# Patient Record
Sex: Female | Born: 1980 | Race: White | Hispanic: No | Smoking: Never smoker
Health system: Southern US, Community
[De-identification: ages and names within clinical notes are randomized; demographics above are authoritative.]

## PROBLEM LIST (undated history)

## (undated) DIAGNOSIS — F909 Attention-deficit hyperactivity disorder, unspecified type: Secondary | ICD-10-CM

## (undated) DIAGNOSIS — D696 Thrombocytopenia, unspecified: Secondary | ICD-10-CM

## (undated) DIAGNOSIS — F419 Anxiety disorder, unspecified: Secondary | ICD-10-CM

## (undated) HISTORY — DX: Attention-deficit hyperactivity disorder, unspecified type: F90.9

## (undated) HISTORY — PX: BREAST SURGERY: SHX581

## (undated) HISTORY — PX: BUNIONECTOMY: SHX129

## (undated) HISTORY — PX: COSMETIC SURGERY: SHX468

## (undated) HISTORY — DX: Anxiety disorder, unspecified: F41.9

## (undated) HISTORY — PX: NECK SURGERY: SHX720

---

## 1999-08-01 ENCOUNTER — Other Ambulatory Visit: Admission: RE | Admit: 1999-08-01 | Discharge: 1999-08-01 | Payer: Self-pay | Admitting: *Deleted

## 1999-12-04 ENCOUNTER — Inpatient Hospital Stay (HOSPITAL_COMMUNITY): Admission: AD | Admit: 1999-12-04 | Discharge: 1999-12-04 | Payer: Self-pay | Admitting: Obstetrics and Gynecology

## 1999-12-24 HISTORY — PX: AUGMENTATION MAMMAPLASTY: SUR837

## 2000-01-16 ENCOUNTER — Other Ambulatory Visit: Admission: RE | Admit: 2000-01-16 | Discharge: 2000-01-16 | Payer: Self-pay | Admitting: *Deleted

## 2000-02-15 ENCOUNTER — Inpatient Hospital Stay (HOSPITAL_COMMUNITY): Admission: AD | Admit: 2000-02-15 | Discharge: 2000-02-15 | Payer: Self-pay | Admitting: *Deleted

## 2000-02-29 ENCOUNTER — Inpatient Hospital Stay (HOSPITAL_COMMUNITY): Admission: AD | Admit: 2000-02-29 | Discharge: 2000-03-02 | Payer: Self-pay | Admitting: Obstetrics and Gynecology

## 2000-03-07 ENCOUNTER — Encounter: Admission: RE | Admit: 2000-03-07 | Discharge: 2000-06-05 | Payer: Self-pay | Admitting: *Deleted

## 2000-04-10 ENCOUNTER — Other Ambulatory Visit: Admission: RE | Admit: 2000-04-10 | Discharge: 2000-04-10 | Payer: Self-pay | Admitting: *Deleted

## 2006-04-01 ENCOUNTER — Ambulatory Visit: Payer: Self-pay | Admitting: Unknown Physician Specialty

## 2006-04-15 ENCOUNTER — Ambulatory Visit: Payer: Self-pay | Admitting: Unknown Physician Specialty

## 2008-06-22 ENCOUNTER — Ambulatory Visit: Payer: Self-pay | Admitting: Internal Medicine

## 2008-07-12 ENCOUNTER — Ambulatory Visit: Payer: Self-pay | Admitting: Internal Medicine

## 2008-07-23 ENCOUNTER — Ambulatory Visit: Payer: Self-pay | Admitting: Internal Medicine

## 2008-08-23 ENCOUNTER — Ambulatory Visit: Payer: Self-pay | Admitting: Internal Medicine

## 2008-10-23 ENCOUNTER — Ambulatory Visit: Payer: Self-pay | Admitting: Internal Medicine

## 2008-11-09 ENCOUNTER — Ambulatory Visit: Payer: Self-pay | Admitting: Internal Medicine

## 2008-11-22 ENCOUNTER — Ambulatory Visit: Payer: Self-pay | Admitting: Internal Medicine

## 2009-09-26 ENCOUNTER — Emergency Department (HOSPITAL_COMMUNITY): Admission: EM | Admit: 2009-09-26 | Discharge: 2009-09-26 | Payer: Self-pay | Admitting: Emergency Medicine

## 2010-01-13 ENCOUNTER — Encounter: Admission: RE | Admit: 2010-01-13 | Discharge: 2010-01-13 | Payer: Self-pay | Admitting: Specialist

## 2010-08-16 ENCOUNTER — Encounter: Payer: Self-pay | Admitting: Maternal & Fetal Medicine

## 2010-11-07 ENCOUNTER — Observation Stay: Payer: Self-pay | Admitting: Obstetrics and Gynecology

## 2010-11-19 ENCOUNTER — Encounter: Payer: Self-pay | Admitting: Maternal and Fetal Medicine

## 2011-01-03 ENCOUNTER — Observation Stay: Payer: Self-pay

## 2011-01-23 ENCOUNTER — Ambulatory Visit: Payer: Self-pay | Admitting: Internal Medicine

## 2011-02-12 ENCOUNTER — Inpatient Hospital Stay: Payer: Self-pay | Admitting: Obstetrics and Gynecology

## 2011-02-13 ENCOUNTER — Ambulatory Visit: Payer: Self-pay | Admitting: Internal Medicine

## 2011-02-17 ENCOUNTER — Ambulatory Visit: Payer: Self-pay | Admitting: Pediatrics

## 2011-02-21 ENCOUNTER — Ambulatory Visit: Payer: Self-pay | Admitting: Internal Medicine

## 2011-12-13 ENCOUNTER — Emergency Department (HOSPITAL_COMMUNITY)
Admission: EM | Admit: 2011-12-13 | Discharge: 2011-12-13 | Disposition: A | Discharge status: Home / Self Care | Payer: 59 | Source: Home / Self Care | Attending: Family Medicine | Admitting: Family Medicine

## 2011-12-13 ENCOUNTER — Encounter: Payer: Self-pay | Admitting: *Deleted

## 2011-12-13 DIAGNOSIS — R6889 Other general symptoms and signs: Secondary | ICD-10-CM

## 2011-12-13 HISTORY — DX: Thrombocytopenia, unspecified: D69.6

## 2011-12-13 MED ORDER — AZITHROMYCIN 250 MG PO TABS: 250.0000 mg | ORAL_TABLET | Freq: Every day | ORAL | Status: AC

## 2011-12-13 MED ORDER — GUAIFENESIN-CODEINE 100-10 MG/5ML PO SYRP: 5.0000 mL | ORAL_SOLUTION | Freq: Four times a day (QID) | ORAL | Status: AC | PRN

## 2011-12-13 NOTE — ED Provider Notes (Signed)
History     CSN: [id number]  Arrival date & time 12/13/11  9147   First MD Initiated Contact with Patient 12/13/11 1040      Chief Complaint  Patient presents with  . Sore Throat    (Consider location/radiation/quality/duration/timing/severity/associated sxs/prior treatment) Patient is a 30 y.o. female presenting with pharyngitis. The history is provided by the patient.  Sore Throat The current episode started yesterday. The problem occurs constantly. The problem has not changed since onset.Associated symptoms comments: Runny nose. Exposed to strep . The symptoms are aggravated by swallowing. The symptoms are relieved by nothing.    Past Medical History  Diagnosis Date  . Platelets decreased     Past Surgical History  Procedure Date  . Bunionectomy     bilateral    History reviewed. No pertinent family history.  History  Substance Use Topics  . Smoking status: Never Smoker   . Smokeless tobacco: Not on file  . Alcohol Use: Yes     socially    OB History    Grav Para Term Preterm Abortions TAB SAB Ect Mult Living                  Review of Systems  Constitutional: Negative.   HENT: Positive for sore throat and rhinorrhea. Negative for ear pain, congestion, sneezing and sinus pressure.   Respiratory: Negative.   Cardiovascular: Negative.   Gastrointestinal: Negative.   Skin: Negative.     Allergies  Review of patient's allergies indicates no known allergies.  Home Medications   Current Outpatient Rx  Name Route Sig Dispense Refill  . AZITHROMYCIN 250 MG PO TABS Oral Take 1 tablet (250 mg total) by mouth daily. Take first 2 tablets together, then 1 every day until finished. 6 tablet 0  . GUAIFENESIN-CODEINE 100-10 MG/5ML PO SYRP Oral Take 5 mLs by mouth every 6 (six) hours as needed for cough. 120 mL 0    BP 118/78  Pulse 59  Temp(Src) 97.4 F (36.3 C) (Oral)  Resp 16  SpO2 98%  LMP 12/05/2011  Physical Exam  Nursing note and vitals  reviewed. Constitutional: She appears well-developed and well-nourished. No distress.  HENT:  Head: Normocephalic and atraumatic.  Right Ear: External ear normal.  Left Ear: External ear normal.       Throat red and slightly swollen  Cardiovascular: Normal rate and regular rhythm.   Pulmonary/Chest: Effort normal and breath sounds normal.  Abdominal: Soft.  Lymphadenopathy:    She has no cervical adenopathy.  Skin: Skin is warm and dry.    ED Course  Procedures (including critical care time)  Labs Reviewed - No data to display No results found.   1. Flu-like symptoms       MDM          Randa Spike, MD 12/13/11 [phone]

## 2011-12-13 NOTE — ED Notes (Signed)
Pt states she started with sorethroat (felt like she had razorblades in her throat when she swallowed) yesterday.  Today has runny nose and slight cough.  No fever that she is aware of. States she was around someone with strep the other day and has a 64 month old at home that she doesn't want to make sick.  Throat red, no exudate noted.

## 2014-05-02 ENCOUNTER — Ambulatory Visit
Admission: RE | Admit: 2014-05-02 | Discharge: 2014-05-02 | Disposition: A | Discharge status: Home / Self Care | Payer: No Typology Code available for payment source | Source: Ambulatory Visit | Attending: Occupational Medicine | Admitting: Occupational Medicine

## 2014-05-02 ENCOUNTER — Other Ambulatory Visit: Payer: Self-pay | Admitting: Occupational Medicine

## 2014-05-02 DIAGNOSIS — Z0000 Encounter for general adult medical examination without abnormal findings: Secondary | ICD-10-CM

## 2015-05-16 ENCOUNTER — Emergency Department (HOSPITAL_COMMUNITY): Payer: 59

## 2015-05-16 ENCOUNTER — Encounter (HOSPITAL_COMMUNITY): Payer: Self-pay | Admitting: Cardiology

## 2015-05-16 ENCOUNTER — Emergency Department (HOSPITAL_COMMUNITY)
Admission: EM | Admit: 2015-05-16 | Discharge: 2015-05-16 | Disposition: A | Discharge status: Home / Self Care | Payer: 59 | Attending: Emergency Medicine | Admitting: Emergency Medicine

## 2015-05-16 DIAGNOSIS — R531 Weakness: Secondary | ICD-10-CM | POA: Insufficient documentation

## 2015-05-16 DIAGNOSIS — Z79899 Other long term (current) drug therapy: Secondary | ICD-10-CM | POA: Diagnosis not present

## 2015-05-16 DIAGNOSIS — Z7952 Long term (current) use of systemic steroids: Secondary | ICD-10-CM | POA: Diagnosis not present

## 2015-05-16 DIAGNOSIS — Z862 Personal history of diseases of the blood and blood-forming organs and certain disorders involving the immune mechanism: Secondary | ICD-10-CM | POA: Insufficient documentation

## 2015-05-16 DIAGNOSIS — M79602 Pain in left arm: Secondary | ICD-10-CM | POA: Diagnosis present

## 2015-05-16 DIAGNOSIS — M5410 Radiculopathy, site unspecified: Secondary | ICD-10-CM | POA: Diagnosis not present

## 2015-05-16 LAB — COMPREHENSIVE METABOLIC PANEL
ALT: 18 U/L (ref 14–54)
AST: 20 U/L (ref 15–41)
Albumin: 3.9 g/dL (ref 3.5–5.0)
Alkaline Phosphatase: 43 U/L (ref 38–126)
Anion gap: 9 (ref 5–15)
BUN: 13 mg/dL (ref 6–20)
CO2: 22 mmol/L (ref 22–32)
Calcium: 9.6 mg/dL (ref 8.9–10.3)
Chloride: 107 mmol/L (ref 101–111)
Creatinine, Ser: 0.65 mg/dL (ref 0.44–1.00)
GFR calc Af Amer: >60 mL/min (ref >60)
GFR calc non Af Amer: >60 mL/min (ref >60)
Glucose, Bld: 106 mg/dL — ABNORMAL HIGH (ref 65–99)
Potassium: 4 mmol/L (ref 3.5–5.1)
Sodium: 138 mmol/L (ref 135–145)
Total Bilirubin: 0.6 mg/dL (ref 0.3–1.2)
Total Protein: 6.8 g/dL (ref 6.5–8.1)

## 2015-05-16 LAB — CBC WITH DIFFERENTIAL/PLATELET
Basophils Absolute: 0 10*3/uL (ref 0.0–0.1)
Basophils Relative: 0 % (ref 0–1)
Eosinophils Absolute: 0 10*3/uL (ref 0.0–0.7)
Eosinophils Relative: 0 % (ref 0–5)
HCT: 42.3 % (ref 36.0–46.0)
Hemoglobin: 14.7 g/dL (ref 12.0–15.0)
Lymphocytes Relative: 13 % (ref 12–46)
Lymphs Abs: 0.9 10*3/uL (ref 0.7–4.0)
MCH: 30.6 pg (ref 26.0–34.0)
MCHC: 34.8 g/dL (ref 30.0–36.0)
MCV: 88.1 fL (ref 78.0–100.0)
Monocytes Absolute: 0.2 10*3/uL (ref 0.1–1.0)
Monocytes Relative: 2 % — ABNORMAL LOW (ref 3–12)
Neutro Abs: 5.8 10*3/uL (ref 1.7–7.7)
Neutrophils Relative %: 85 % — ABNORMAL HIGH (ref 43–77)
Platelets: 133 10*3/uL — ABNORMAL LOW (ref 150–400)
RBC: 4.8 MIL/uL (ref 3.87–5.11)
RDW: 12.5 % (ref 11.5–15.5)
WBC: 6.9 10*3/uL (ref 4.0–10.5)

## 2015-05-16 MED ORDER — OXYCODONE-ACETAMINOPHEN 5-325 MG PO TABS: 1.0000 | ORAL_TABLET | Freq: Once | ORAL | Status: DC

## 2015-05-16 MED ORDER — HYDROMORPHONE HCL 1 MG/ML IJ SOLN
1.0000 mg | Freq: Once | INTRAMUSCULAR | Status: AC
  Administered 2015-05-16: 1 mg via INTRAVENOUS
  Filled 2015-05-16: qty 1

## 2015-05-16 MED ORDER — PREDNISONE 10 MG PO TABS: ORAL_TABLET | ORAL | Status: DC

## 2015-05-16 MED ORDER — OXYCODONE-ACETAMINOPHEN 5-325 MG PO TABS: 1.0000 | ORAL_TABLET | ORAL | Status: DC | PRN

## 2015-05-16 NOTE — ED Provider Notes (Signed)
CSN: [id number]     Arrival date & time 05/16/15  6578 History   First MD Initiated Contact with Patient 05/16/15 1057     Chief Complaint  Patient presents with  . Back Pain  . Arm Pain    HPI   34 year old female presents today with neck and shoulder and back pain. Patient additionally notes decreased left-sided sensation, and weakness. The patient reports that one week ago after cleaning her house she developed left shoulder tightness and "knot" . She states that she went to a massage therapist but continued to have pain, it progressed into left shoulder pain with radiation up into her neck and down her arm. He reports a "achy" sensation into the left arm with decreased sensation, and starting today inability to use extremity. She also notes weakness on the left side of her leg, no decreased sensation. Patient denies history of trauma, history of the same previously, headache, nausea, vomiting, fever, shortness of breath, abdominal pain, lower extremity swelling or edema, saddle anesthesia, bowel or bladder incontinence. She reports that she was originally seen at urgent care who gave her a steroid injection, and Flexeril. She notes those therapies did not help, followed up with her primary care who started her on prednisone, Percocet, and ibuprofen. He reported that if symptoms continue to persist she may need an MRI for further evaluation. She reports that the pain has continued to persist, worsening with the addition of her weakness on the left side today. Patient denies any recent illness, no significant or contributing comorbidities.     Past Medical History  Diagnosis Date  . Platelets decreased    Past Surgical History  Procedure Laterality Date  . Bunionectomy      bilateral   History reviewed. No pertinent family history. History  Substance Use Topics  . Smoking status: Never Smoker   . Smokeless tobacco: Not on file  . Alcohol Use: Yes     Comment: socially   OB History     No data available     Review of Systems  All other systems reviewed and are negative.   Allergies  Review of patient's allergies indicates no known allergies.  Home Medications   Prior to Admission medications   Medication Sig Start Date End Date Taking? Authorizing Provider  acetaminophen (TYLENOL) 500 MG tablet Take 1,000 mg by mouth every 6 (six) hours as needed for mild pain.   Yes Historical Provider, MD  cyclobenzaprine (FLEXERIL) 10 MG tablet Take 10 mg by mouth 3 (three) times daily as needed for muscle spasms.   Yes Historical Provider, MD  docusate sodium (COLACE) 50 MG capsule Take 50 mg by mouth 2 (two) times daily.   Yes Historical Provider, MD  ibuprofen (ADVIL,MOTRIN) 600 MG tablet Take 600 mg by mouth every 8 (eight) hours as needed for mild pain.   Yes Historical Provider, MD  lisdexamfetamine (VYVANSE) 30 MG capsule Take 30 mg by mouth daily.   Yes Historical Provider, MD  lisdexamfetamine (VYVANSE) 40 MG capsule Take 40 mg by mouth every morning.   Yes Historical Provider, MD  loratadine (CLARITIN) 10 MG tablet Take 10 mg by mouth daily.   Yes Historical Provider, MD  Norgestimate-Ethinyl Estradiol Triphasic 0.18/0.215/0.25 MG-35 MCG tablet Take 1 tablet by mouth daily.   Yes Historical Provider, MD  oxyCODONE-acetaminophen (PERCOCET/ROXICET) 5-325 MG per tablet Take 1-2 tablets by mouth every 6 (six) hours as needed for severe pain.   Yes Historical Provider, MD  tapentadol (NUCYNTA) 50  MG TABS tablet Take 50 mg by mouth every 6 (six) hours as needed for moderate pain.   Yes Historical Provider, MD  cyclobenzaprine (FLEXERIL) 5 MG tablet Take 5 mg by mouth 3 (three) times daily as needed for muscle spasms.    Historical Provider, MD  predniSONE (DELTASONE) 20 MG tablet Take 20 mg by mouth daily with breakfast.    Historical Provider, MD   BP 140/108 mmHg  Pulse 97  Temp(Src) 98.1 F (36.7 C)  Resp 17  Ht  (1.702 m)  Wt 136 lb (61.689 kg)  BMI 21.30 kg/m2   SpO2 100%  LMP 04/25/2015 Physical Exam  Constitutional: She is oriented to person, place, and time. She appears well-developed and well-nourished.  HENT:  Head: Normocephalic and atraumatic.  Eyes: Conjunctivae are normal. Pupils are equal, round, and reactive to light. Right eye exhibits no discharge. Left eye exhibits no discharge. No scleral icterus.  Neck: Normal range of motion. No JVD present. No tracheal deviation present.  Pulmonary/Chest: Effort normal. No stridor.  Musculoskeletal:  No obvious deformities, asymmetry, or atrophy of the neck shoulder upper lower extremities. No signs of trauma.  Strength 2/5 to left upper extremity with flexion and extension rotation, grip 3/5  Lower extremity hip flexion, knee flexion  and extension 4 out of 5  Patellar reflexes 2+, triceps 2+    Neurological: She is alert and oriented to person, place, and time. She displays no atrophy and no tremor. A sensory deficit is present. No cranial nerve deficit. She exhibits normal muscle tone. She displays no seizure activity. Coordination normal. GCS eye subscore is 4. GCS verbal subscore is 5. GCS motor subscore is 6.  Reflex Scores:      Bicep reflexes are 2+ on the right side and 2+ on the left side.      Patellar reflexes are 2+ on the right side and 2+ on the left side. Decreased sensation to entire left upper extremity with no focal findings  Psychiatric: She has a normal mood and affect. Her behavior is normal. Judgment and thought content normal.  Nursing note and vitals reviewed.   ED Course  Procedures (including critical care time) Labs Review Labs Reviewed - No data to display  Imaging Review Mr Brain Wo Contrast  05/16/2015   CLINICAL DATA:  Weakness.  Burning pain left arm  EXAM: MRI HEAD WITHOUT CONTRAST  TECHNIQUE: Multiplanar, multiecho pulse sequences of the brain and surrounding structures were obtained without intravenous contrast.  COMPARISON:  MR head 01/13/2010   FINDINGS: Ventricle size is normal.  Cerebral volume is normal.  Pituitary normal in size. Cervical medullary junction normal. Paranasal sinuses show mild mucosal edema bilateral maxillary sinuses. Mastoid sinus is clear bilaterally.  Negative for acute infarct.  No significant chronic ischemia  Negative for demyelinating disease.  Cerebral white matter normal.  Negative for intracranial hemorrhage  Negative for mass or edema.  IMPRESSION: Normal unenhanced MRI of the brain.   Electronically Signed   By: Marlan Palau M.D.   On: 05/16/2015 13:54   Mr Cervical Spine Wo Contrast  05/16/2015   CLINICAL DATA:  34 year old female with new onset left arm pain. No relief with conservative treatment. No known injury. Initial encounter.  EXAM: MRI CERVICAL SPINE WITHOUT CONTRAST  TECHNIQUE: Multiplanar, multisequence MR imaging of the cervical spine was performed. No intravenous contrast was administered.  COMPARISON:  01/13/2010 brain MR. No comparison cervical spine MR.  FINDINGS: Some of the sequences are motion degraded.  Cervical medullary junction and visualized intracranial structures unremarkable. No focal cervical cord signal abnormality.  Visualized paravertebral structures unremarkable.  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  C2-3:  Negative.  C3-4: Minimal bulge. Minimal uncinate hypertrophy. Minimal narrowing ventral aspect of the thecal sac and neural foramen.  C4-5: Minimal to mild bulge greater to left. Slight narrowing ventral aspect of the thecal sac greater on left.  C5-6: Moderate to large broad-based protrusion with greatest extension left paracentral/ posterior lateral position. Elevation of the posterior longitudinal ligament. Spinal stenosis with cord flattening greater on the left. Narrowing of the origin of the neural foramen greater on the left.  C6-7:  Minimal right uncinate hypertrophy and foraminal narrowing.  C7-T1:  Negative.  IMPRESSION: Most notable finding on the present examination is  the C5-6 disc protrusion which is greater to the left with associated mass effect as detailed above. Kyphosis is centered at this level.   Electronically Signed   By: Lacy Duverney M.D.   On: 05/16/2015 13:33     EKG Interpretation   Date/Time:  Tuesday May 16 2015 10:07:58 EDT Ventricular Rate:  99 PR Interval:  112 QRS Duration: 80 QT Interval:  336 QTC Calculation: 431 R Axis:   83 Text Interpretation:  Normal sinus rhythm Nonspecific ST abnormality  Abnormal ECG Confirmed by HARRISON  MD, FORREST ([id number]) on 05/16/2015  11:01:41 AM      MDM   Final diagnoses:  Weakness  Radiculopathy affecting upper extremity    Labs: CBC, CMP  Imaging: MRI brain without, MRI cervical without- significant for C5-C6 with disc protrusion  Consults: none  Therapeutics:  Dilaudid, Percocet  Assessment: Radiculopathy  Plan: Patient originally presented with progressive neck stiffness, soreness and progressed to decreased sensation or weakness of her left upper extremity. This was an atraumatic event, likely exacerbated by her activity of cleaning the house. At time of my initial evaluation patient reported left-sided weakness including upper and lower extremities with decreased sensation throughout. 2+ patellar and tricep reflexes at that time. Patient denied headache, cranial nerve defects, or any other focal neurologic deficits other than those indicated above. MRI brain showed no acute findings, she was treated with Dilaudid with great improvement in both her neuro and pain scale. MRI cervical showed significance of C5-C6 disc protrusion. Repeat neuro exam showed no focal sensory deficits to the upper or lower extremities. Lower extremities strength 5/5 with 2+ patellar reflexes. Her upper extremity strength was diminished, improved from initial eval; patient reports this is due to pain and not necessarily true "weakness". She had good flexion extension of her wrist grip strength normal, elbow  flexion and extension 4 out of 5, shoulder exam difficult due to pain. Uncertain why patient's repeat neuro exam was significantly improved with only pain medication; although I feel her original neuro deficits or due to patient effort. Due to patient's rapidly improving neuro exam, controlled pain, ability for close specialty follow-up patient was discharged home. He was continued on her prednisone therapy with another 7 days, given another prescription for her oral pain medication, instructed to use her Flexeril that was previously prescribed. Neurosurgical contact information was given, patient instructed to contact them immediately after leaving the ED for immediate follow-up evaluation. Patient was given strict return precautions the event symptoms worsened, she verbalized her understanding to the importance of follow-up evaluation and return to the emergency room if needed. She was discharged home with her husband and mother who agreed to monitor her for new or worsening  changes patient had no further questions or concerns at time of discharge.      Cecia Egge, PA-C 05/24/1Eyvonne Mechanic6 [id number]  Purvis SheffieldForrest Harrison, MD 05/16/15 [phone]

## 2015-05-16 NOTE — ED Notes (Signed)
MD at bedside. 

## 2015-05-16 NOTE — ED Notes (Signed)
Pt reports she was seen over the week and given pain medication and prednisone for back pain and pain that is radiating down into her left arm. Reports the pain is sharp in nature and causing some SOB. Told to ome in if the pain was no better and she may need an MRI.

## 2015-05-16 NOTE — Discharge Instructions (Signed)
Please monitor for worsening signs or symptoms, return to the emergency room immediately if any present. His contact neurosurgery for further evaluation and management. Please use medication as directed.

## 2015-07-05 ENCOUNTER — Encounter (HOSPITAL_COMMUNITY): Payer: Self-pay | Admitting: Emergency Medicine

## 2015-07-05 DIAGNOSIS — Z3202 Encounter for pregnancy test, result negative: Secondary | ICD-10-CM | POA: Insufficient documentation

## 2015-07-05 DIAGNOSIS — K5900 Constipation, unspecified: Secondary | ICD-10-CM | POA: Diagnosis not present

## 2015-07-05 DIAGNOSIS — Z862 Personal history of diseases of the blood and blood-forming organs and certain disorders involving the immune mechanism: Secondary | ICD-10-CM | POA: Diagnosis not present

## 2015-07-05 DIAGNOSIS — Z79899 Other long term (current) drug therapy: Secondary | ICD-10-CM | POA: Insufficient documentation

## 2015-07-05 DIAGNOSIS — N760 Acute vaginitis: Secondary | ICD-10-CM | POA: Insufficient documentation

## 2015-07-05 DIAGNOSIS — Z7951 Long term (current) use of inhaled steroids: Secondary | ICD-10-CM | POA: Diagnosis not present

## 2015-07-05 DIAGNOSIS — R109 Unspecified abdominal pain: Secondary | ICD-10-CM | POA: Diagnosis present

## 2015-07-05 NOTE — ED Notes (Signed)
Pt had neck surgery on Friday.  States last BM was 1 week ago.  Pt taking medications today to try to relieve constipation.  States she felt a lot of pressure around 9pm while trying to use bathroom and when she got up she noticed vaginal prolapse.  C/o generalized abd pain and vaginal pain.

## 2015-07-06 ENCOUNTER — Encounter (HOSPITAL_COMMUNITY): Payer: Self-pay

## 2015-07-06 ENCOUNTER — Emergency Department (HOSPITAL_COMMUNITY)
Admission: EM | Admit: 2015-07-06 | Discharge: 2015-07-06 | Disposition: A | Discharge status: Home / Self Care | Payer: 59 | Attending: Emergency Medicine | Admitting: Emergency Medicine

## 2015-07-06 ENCOUNTER — Emergency Department (HOSPITAL_COMMUNITY): Payer: 59

## 2015-07-06 DIAGNOSIS — K5900 Constipation, unspecified: Secondary | ICD-10-CM

## 2015-07-06 DIAGNOSIS — N760 Acute vaginitis: Secondary | ICD-10-CM

## 2015-07-06 DIAGNOSIS — B9689 Other specified bacterial agents as the cause of diseases classified elsewhere: Secondary | ICD-10-CM

## 2015-07-06 LAB — URINALYSIS, ROUTINE W REFLEX MICROSCOPIC
Bilirubin Urine: NEGATIVE
Glucose, UA: NEGATIVE mg/dL
HGB URINE DIPSTICK: NEGATIVE
Ketones, ur: NEGATIVE mg/dL
Leukocytes, UA: NEGATIVE
Nitrite: NEGATIVE
Protein, ur: NEGATIVE mg/dL
Specific Gravity, Urine: 1.011 (ref 1.005–1.030)
Urobilinogen, UA: 0.2 mg/dL (ref 0.0–1.0)
pH: 6.5 (ref 5.0–8.0)

## 2015-07-06 LAB — COMPREHENSIVE METABOLIC PANEL
ALBUMIN: 3.9 g/dL (ref 3.5–5.0)
ALK PHOS: 77 U/L (ref 38–126)
ALT: 21 U/L (ref 14–54)
ANION GAP: 6 (ref 5–15)
AST: 21 U/L (ref 15–41)
BUN: 9 mg/dL (ref 6–20)
CO2: 28 mmol/L (ref 22–32)
CREATININE: 0.72 mg/dL (ref 0.44–1.00)
Calcium: 9.3 mg/dL (ref 8.9–10.3)
Chloride: 102 mmol/L (ref 101–111)
GFR calc Af Amer: >60 mL/min (ref >60)
Glucose, Bld: 127 mg/dL — ABNORMAL HIGH (ref 65–99)
Potassium: 4.4 mmol/L (ref 3.5–5.1)
Sodium: 136 mmol/L (ref 135–145)
TOTAL PROTEIN: 7.2 g/dL (ref 6.5–8.1)
Total Bilirubin: 0.5 mg/dL (ref 0.3–1.2)

## 2015-07-06 LAB — LIPASE, BLOOD: LIPASE: 20 U/L — AB (ref 22–51)

## 2015-07-06 LAB — CBC
HEMATOCRIT: 39 % (ref 36.0–46.0)
Hemoglobin: 13.5 g/dL (ref 12.0–15.0)
MCH: 30.9 pg (ref 26.0–34.0)
MCHC: 34.6 g/dL (ref 30.0–36.0)
MCV: 89.2 fL (ref 78.0–100.0)
Platelets: 142 10*3/uL — ABNORMAL LOW (ref 150–400)
RBC: 4.37 MIL/uL (ref 3.87–5.11)
RDW: 12.3 % (ref 11.5–15.5)
WBC: 7.7 10*3/uL (ref 4.0–10.5)

## 2015-07-06 LAB — WET PREP, GENITAL
TRICH WET PREP: NONE SEEN
WBC, Wet Prep HPF POC: NONE SEEN
Yeast Wet Prep HPF POC: NONE SEEN

## 2015-07-06 LAB — I-STAT BETA HCG BLOOD, ED (MC, WL, AP ONLY): I-stat hCG, quantitative: <5 m[IU]/mL (ref <5)

## 2015-07-06 LAB — PREGNANCY, URINE: Preg Test, Ur: NEGATIVE

## 2015-07-06 MED ORDER — METRONIDAZOLE 500 MG PO TABS
500.0000 mg | ORAL_TABLET | Freq: Once | ORAL | Status: AC
  Administered 2015-07-06: 500 mg via ORAL
  Filled 2015-07-06: qty 1

## 2015-07-06 MED ORDER — POLYETHYLENE GLYCOL 3350 17 GM/SCOOP PO POWD: 17.0000 g | Freq: Two times a day (BID) | ORAL | Status: DC

## 2015-07-06 MED ORDER — KETOROLAC TROMETHAMINE 30 MG/ML IJ SOLN
30.0000 mg | Freq: Once | INTRAMUSCULAR | Status: AC
  Administered 2015-07-06: 30 mg via INTRAVENOUS
  Filled 2015-07-06: qty 1

## 2015-07-06 MED ORDER — SENNOSIDES-DOCUSATE SODIUM 8.6-50 MG PO TABS: 2.0000 | ORAL_TABLET | Freq: Two times a day (BID) | ORAL | Status: DC

## 2015-07-06 MED ORDER — SODIUM CHLORIDE 0.9 % IV BOLUS (SEPSIS)
1000.0000 mL | Freq: Once | INTRAVENOUS | Status: AC
  Administered 2015-07-06: 1000 mL via INTRAVENOUS

## 2015-07-06 MED ORDER — DICYCLOMINE HCL 10 MG/ML IM SOLN
20.0000 mg | Freq: Once | INTRAMUSCULAR | Status: AC
  Administered 2015-07-06: 20 mg via INTRAMUSCULAR
  Filled 2015-07-06: qty 2

## 2015-07-06 MED ORDER — METRONIDAZOLE 500 MG PO TABS: 500.0000 mg | ORAL_TABLET | Freq: Two times a day (BID) | ORAL | Status: DC

## 2015-07-06 MED ORDER — MELOXICAM 7.5 MG PO TABS: 7.5000 mg | ORAL_TABLET | Freq: Every day | ORAL | Status: DC

## 2015-07-06 NOTE — ED Notes (Signed)
Pt in bathroom

## 2015-07-06 NOTE — ED Notes (Signed)
Called lab to check urine pregnancy results, test was not run.  Will be processing now.

## 2015-07-06 NOTE — ED Provider Notes (Signed)
CSN: [id number]     Arrival date & time 07/05/15  2344 History  This chart was scribed for Brianna Junita Kubota, MD by Abel PrestoKara Demonbreun, ED Scribe. This patient was seen in room B14C/B14C and the patient's care was started at 12:47 AM.    Chief Complaint  Patient presents with  . Vaginal Prolapse     Patient is a 34 y.o. female presenting with constipation. The history is provided by the patient. No language interpreter was used.  Constipation Severity:  Severe Time since last bowel movement:  1 week Timing:  Constant Progression:  Unchanged Chronicity:  New Context: not dehydration   Stool description:  Watery Relieved by:  Nothing Worsened by:  Narcotic pain medications Ineffective treatments:  Laxatives (suppository) Associated symptoms: abdominal pain   Associated symptoms: no diarrhea, no fever, no nausea and no vomiting   Risk factors: no hx of abdominal surgery    HPI Comments: Brianna Perkins is a 34 y.o. female who presents to the Emergency Department complaining of constipation with last BM 1 week ago. 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 Pt states she has used OTC relief since onset of constipation. She was seen by surgeon today who instructed her to try a suppository for relief. She did so and used magnesium citrate with no relief. She reports associated generalized abdominal pressure and vaginal pain. After no relief she attempted to place another suppository and states she "felt stuff" from her vagina. She is concerned for a vaginal prolapse. Pt denies any other complaints at this time. Per [place] [facility], pt was prescribed 90 pills Dilaudid on 07/01/15, 60 Dilaudid pills on 06/12/15, and 60 pills 05/27/15 oxycodone.  Past Medical History  Diagnosis Date  . Platelets decreased    Past Surgical History  Procedure Laterality Date  . Bunionectomy      bilateral  . Neck surgery    . Cosmetic surgery    . Breast surgery     History reviewed. No pertinent family history. History   Substance Use Topics  . Smoking status: Never Smoker   . Smokeless tobacco: Not on file  . Alcohol Use: Yes     Comment: socially   OB History    No data available     Review of Systems  Constitutional: Negative for fever and chills.  Gastrointestinal: Positive for abdominal pain and constipation. Negative for nausea, vomiting and diarrhea.  Genitourinary: Positive for vaginal pain.  All other systems reviewed and are negative.     Allergies  Review of patient's allergies indicates no known allergies.  Home Medications   Prior to Admission medications   Medication Sig Start Date End Date Taking? Authorizing Provider  acetaminophen (TYLENOL) 500 MG tablet Take 1,000 mg by mouth every 6 (six) hours as needed for mild pain.    Historical Provider, MD  atomoxetine (STRATTERA) 60 MG capsule Take 60 mg by mouth daily.    Historical Provider, MD  cyclobenzaprine (FLEXERIL) 10 MG tablet Take 10 mg by mouth 3 (three) times daily as needed for muscle spasms.    Historical Provider, MD  DEXAMETHASONE ACETATE IJ Inject 1 Dose as directed once.    Historical Provider, MD  docusate sodium (COLACE) 50 MG capsule Take 50 mg by mouth 2 (two) times daily.    Historical Provider, MD  fluticasone (FLONASE) 50 MCG/ACT nasal spray Place 1 spray into both nostrils daily.    Historical Provider, MD  ibuprofen (ADVIL,MOTRIN) 600 MG tablet Take 600 mg by mouth every  8 (eight) hours as needed for mild pain.    Historical Provider, MD  lisdexamfetamine (VYVANSE) 30 MG capsule Take 30 mg by mouth daily. 30mg  in the morning, 40mg  in the evening    Historical Provider, MD  lisdexamfetamine (VYVANSE) 40 MG capsule Take 40 mg by mouth every evening. 30mg  in the morning, 40mg  in the evening    Historical Provider, MD  loratadine (CLARITIN) 10 MG tablet Take 10 mg by mouth daily.    Historical Provider, MD  NALBUPHINE HCL IJ Inject 1 Dose as directed once.    Historical Provider, MD  Norgestimate-Ethinyl  Estradiol Triphasic 0.18/0.215/0.25 MG-35 MCG tablet Take 1 tablet by mouth daily.    Historical Provider, MD  oxyCODONE-acetaminophen (PERCOCET/ROXICET) 5-325 MG per tablet Take 1 tablet by mouth every 4 (four) hours as needed for severe pain. 05/16/15   Eyvonne Mechanic, PA-C  predniSONE (DELTASONE) 10 MG tablet 40 mg x 1 day 1-3, 20 mg x1 day 4-5 , 10 mg x 1 day 5-7 05/16/15   Eyvonne Mechanic, PA-C  tapentadol (NUCYNTA) 50 MG TABS tablet Take 50 mg by mouth every 6 (six) hours as needed for moderate pain.    Historical Provider, MD   BP 117/74 mmHg  Pulse 89  Temp(Src) 98.3 F (36.8 C) (Oral)  Resp 18  SpO2 100%  LMP 06/19/2015 Physical Exam  Constitutional: She is oriented to person, place, and time. She appears well-developed and well-nourished.  HENT:  Head: Normocephalic and atraumatic.  Mouth/Throat: Oropharynx is clear and moist.  Eyes: Conjunctivae and EOM are normal. Pupils are equal, round, and reactive to light.  Neck: Normal range of motion. Neck supple.  Incision CDI  Cardiovascular: Normal rate, regular rhythm and normal heart sounds.  Exam reveals no friction rub.   No murmur heard. Pulmonary/Chest: Effort normal and breath sounds normal. She has no wheezes. She has no rales.  Abdominal: Soft. Bowel sounds are increased. There is no tenderness. There is no rigidity, no rebound, no guarding, no tenderness at McBurney's point and negative Murphy's sign.  Constipation throughout  Genitourinary:    Vaginal discharge found.  Swelling to left labia minora, cervix is closed and high, chaperone present  Musculoskeletal: Normal range of motion.  Neurological: She is alert and oriented to person, place, and time.  Skin: Skin is warm and dry.  Psychiatric: She has a normal mood and affect. Her behavior is normal.  Nursing note and vitals reviewed.   ED Course  Procedures (including critical care time) DIAGNOSTIC STUDIES: Oxygen Saturation is 100% on room, normal by my  interpretation.    COORDINATION OF CARE: 12:53 AM Discussed treatment plan with patient at beside, the patient agrees with the plan and has no further questions at this time.   Labs Review Labs Reviewed  WET PREP, GENITAL - Abnormal; Notable for the following:    Clue Cells Wet Prep HPF POC FEW (*)    All other components within normal limits  COMPREHENSIVE METABOLIC PANEL - Abnormal; Notable for the following:    Glucose, Bld 127 (*)    All other components within normal limits  CBC - Abnormal; Notable for the following:    Platelets 142 (*)    All other components within normal limits  LIPASE, BLOOD - Abnormal; Notable for the following:    Lipase 20 (*)    All other components within normal limits  URINALYSIS, ROUTINE W REFLEX MICROSCOPIC (NOT AT [facility])  PREGNANCY, URINE  I-STAT BETA HCG BLOOD, ED ([facility], [facility],  [facility] ONLY)    Imaging Review No results found.   EKG Interpretation None      MDM   Final diagnoses:  None  Swelling of the labia minora, likely from pressure there is no prolapse.  Will treat for BV though swelling is likely from straining to have BM. Witch hazel on pads, follow up with GYN.  miralax BID x 7 days with sennokot BID.  Will not prescribe more narcotics for constipation as that is the source of the pain.  Follow up with your surgeon  I personally performed the services described in this documentation, which was scribed in my presence. The recorded information has been reviewed and is accurate.      Cy Blamer, MD 07/06/15 [phone]

## 2015-07-06 NOTE — ED Notes (Signed)
Pt stable, ambulatory, states understanding of discharge instructions, husband at bedside

## 2015-07-06 NOTE — ED Notes (Signed)
Called lab to add on urine pregnancy

## 2019-09-24 ENCOUNTER — Other Ambulatory Visit: Payer: Self-pay

## 2019-09-24 DIAGNOSIS — Z20822 Contact with and (suspected) exposure to covid-19: Secondary | ICD-10-CM

## 2019-09-25 LAB — NOVEL CORONAVIRUS, NAA: SARS-CoV-2, NAA: DETECTED — AB

## 2020-11-22 DIAGNOSIS — Z419 Encounter for procedure for purposes other than remedying health state, unspecified: Secondary | ICD-10-CM | POA: Diagnosis not present

## 2020-12-23 DIAGNOSIS — Z419 Encounter for procedure for purposes other than remedying health state, unspecified: Secondary | ICD-10-CM | POA: Diagnosis not present

## 2021-01-16 ENCOUNTER — Other Ambulatory Visit (HOSPITAL_COMMUNITY)
Admission: RE | Admit: 2021-01-16 | Discharge: 2021-01-16 | Disposition: A | Discharge status: Home / Self Care | Payer: Medicaid Other | Source: Ambulatory Visit | Attending: Obstetrics and Gynecology | Admitting: Obstetrics and Gynecology

## 2021-01-16 ENCOUNTER — Other Ambulatory Visit: Payer: Self-pay

## 2021-01-16 ENCOUNTER — Encounter: Payer: Self-pay | Admitting: Obstetrics and Gynecology

## 2021-01-16 ENCOUNTER — Ambulatory Visit (INDEPENDENT_AMBULATORY_CARE_PROVIDER_SITE_OTHER): Payer: Medicaid Other | Admitting: Obstetrics and Gynecology

## 2021-01-16 VITALS — BP 120/70 | Ht 67.0 in | Wt 174.0 lb

## 2021-01-16 DIAGNOSIS — Z124 Encounter for screening for malignant neoplasm of cervix: Secondary | ICD-10-CM | POA: Diagnosis not present

## 2021-01-16 DIAGNOSIS — Z1151 Encounter for screening for human papillomavirus (HPV): Secondary | ICD-10-CM

## 2021-01-16 DIAGNOSIS — Z30431 Encounter for routine checking of intrauterine contraceptive device: Secondary | ICD-10-CM | POA: Diagnosis not present

## 2021-01-16 DIAGNOSIS — Z1231 Encounter for screening mammogram for malignant neoplasm of breast: Secondary | ICD-10-CM | POA: Diagnosis not present

## 2021-01-16 DIAGNOSIS — Z01419 Encounter for gynecological examination (general) (routine) without abnormal findings: Secondary | ICD-10-CM | POA: Diagnosis not present

## 2021-01-16 DIAGNOSIS — Z0000 Encounter for general adult medical examination without abnormal findings: Secondary | ICD-10-CM

## 2021-01-16 NOTE — Progress Notes (Addendum)
PCP:  Derwood Kaplan, MD   Chief Complaint  Patient presents with  . Gynecologic Exam    No concerns     HPI:      Ms. Brianna Perkins is a 40 y.o. No obstetric history on file. whose LMP was No LMP recorded. (Menstrual status: IUD)., presents today for her NP annual examination.  Her menses are absent with IUD. Has occas light bleeding with wiping past 2 months, amenorrheic prior to this. No dysmen.   Sex activity: single partner, contraception - IUD. Mirena placed 10/29/2018. No dyspareunia/bleeding. Last Pap: 09/29/18, 01/30/17,01/22/16, 11/01/14 Results were neg per reviewed med records. Don't see any HPV testing results; hx of abn in college with repeats. No tx done.  Hx of STDs: HPV  Last mammogram: never; has implants bilat. There is no FH of breast cancer. There is no FH of ovarian cancer. The patient does do self-breast exams.  Tobacco use: The patient denies current or previous tobacco use. Alcohol use: social drinker No drug use.  Exercise: moderately active  She does get adequate calcium and Vitamin D in her diet. Labs with PCP about 2 yrs ago.  Hx of low platelets, eval by hematology in past without cause. No f/u needed per pt. Occas has gum bleeding and bruises easily.   Past Medical History:  Diagnosis Date  . Platelets decreased (HCC)     Past Surgical History:  Procedure Laterality Date  . BREAST SURGERY    . BUNIONECTOMY     bilateral  . COSMETIC SURGERY    . NECK SURGERY      Family History  Problem Relation Age of Onset  . Prostate cancer Father        55s  . Leukemia Paternal Uncle   . Prostate cancer Paternal Uncle        34s (identical twin to dad)  . Breast cancer Neg Hx     Social History   Socioeconomic History  . Marital status: Legally Separated    Spouse name: Not on file  . Number of children: Not on file  . Years of education: Not on file  . Highest education level: Not on file  Occupational History  . Not on file   Tobacco Use  . Smoking status: Never Smoker  . Smokeless tobacco: Never Used  Vaping Use  . Vaping Use: Never used  Substance and Sexual Activity  . Alcohol use: Yes    Comment: socially  . Drug use: No  . Sexual activity: Yes    Birth control/protection: I.U.D.    Comment: Mirena  Other Topics Concern  . Not on file  Social History Narrative  . Not on file   Social Determinants of Health   Financial Resource Strain: Not on file  Food Insecurity: Not on file  Transportation Needs: Not on file  Physical Activity: Not on file  Stress: Not on file  Social Connections: Not on file  Intimate Partner Violence: Not on file     Current Outpatient Medications:  .  ALPRAZolam (XANAX) 0.5 MG tablet, alprazolam 0.5 mg tablet, Disp: , Rfl:  .  Amphet-Dextroamphet 3-Bead ER (MYDAYIS) 50 MG CP24, , Disp: , Rfl:  .  amphetamine-dextroamphetamine (ADDERALL) 20 MG tablet, , Disp: , Rfl:  .  fluticasone (FLONASE) 50 MCG/ACT nasal spray, Place 1 spray into both nostrils daily., Disp: , Rfl:  .  levonorgestrel (MIRENA) 20 MCG/24HR IUD, 1 each by Intrauterine route once. 10/29/2018, Disp: , Rfl:  ROS:  Review of Systems  Constitutional: Negative for fatigue, fever and unexpected weight change.  Respiratory: Negative for cough, shortness of breath and wheezing.   Cardiovascular: Negative for chest pain, palpitations and leg swelling.  Gastrointestinal: Negative for blood in stool, constipation, diarrhea, nausea and vomiting.  Endocrine: Negative for cold intolerance, heat intolerance and polyuria.  Genitourinary: Negative for dyspareunia, dysuria, flank pain, frequency, genital sores, hematuria, menstrual problem, pelvic pain, urgency, vaginal bleeding, vaginal discharge and vaginal pain.  Musculoskeletal: Negative for back pain, joint swelling and myalgias.  Skin: Negative for rash.  Neurological: Negative for dizziness, syncope, light-headedness, numbness and headaches.   Hematological: Negative for adenopathy.  Psychiatric/Behavioral: Negative for agitation, confusion, sleep disturbance and suicidal ideas. The patient is not nervous/anxious.   BREAST: No symptoms   Objective: BP 120/70   Ht 5\' 7"  (1.702 m)   Wt 174 lb (78.9 kg)   BMI 27.25 kg/m    Physical Exam Constitutional:      Appearance: She is well-developed.  Genitourinary:     Vulva normal.     Right Labia: No rash, tenderness or lesions.    Left Labia: No tenderness, lesions or rash.    No vaginal discharge, erythema or tenderness.      Right Adnexa: not tender and no mass present.    Left Adnexa: not tender and no mass present.    No cervical friability or polyp.     Uterus is not enlarged or tender.  Breasts:     Right: No mass, nipple discharge, skin change or tenderness.     Left: No mass, nipple discharge, skin change or tenderness.    Neck:     Thyroid: No thyromegaly.  Cardiovascular:     Rate and Rhythm: Normal rate and regular rhythm.     Heart sounds: Normal heart sounds. No murmur heard.   Pulmonary:     Effort: Pulmonary effort is normal.     Breath sounds: Normal breath sounds.  Abdominal:     Palpations: Abdomen is soft.     Tenderness: There is no abdominal tenderness. There is no guarding or rebound.  Musculoskeletal:        General: Normal range of motion.     Cervical back: Normal range of motion.  Lymphadenopathy:     Cervical: No cervical adenopathy.  Neurological:     General: No focal deficit present.     Mental Status: She is alert and oriented to person, place, and time.     Cranial Nerves: No cranial nerve deficit.  Skin:    General: Skin is warm and dry.  Psychiatric:        Mood and Affect: Mood normal.        Behavior: Behavior normal.        Thought Content: Thought content normal.        Judgment: Judgment normal.  Vitals reviewed.     Assessment/Plan: Encounter for annual routine gynecological examination  Cervical cancer  screening - Plan: Cytology - PAP  Screening for HPV (human papillomavirus) - Plan: Cytology - PAP  Encounter for screening mammogram for malignant neoplasm of breast - Plan: MM 3D SCREEN BREAST BILATERAL; pt to sched mammo  Encounter for routine checking of intrauterine contraceptive device (IUD); IUD in place, has 7 yr indication currently         GYN counsel breast self exam, mammography screening, adequate intake of calcium and vitamin D, diet and exercise     F/U  Return in  about 1 year (around 01/16/2022).   B. , PA-C 01/16/2021 10:54 AM

## 2021-01-16 NOTE — Patient Instructions (Signed)
I value your feedback and you entrusting us with your care. If you get a [facility] patient survey, I would appreciate you taking the time to let us know about your experience today. Thank you!  [facility]: [phone]  [facility]: [phone]    

## 2021-01-18 LAB — CYTOLOGY - PAP
Comment: NEGATIVE
Diagnosis: NEGATIVE
High risk HPV: NEGATIVE

## 2021-01-23 DIAGNOSIS — Z419 Encounter for procedure for purposes other than remedying health state, unspecified: Secondary | ICD-10-CM | POA: Diagnosis not present

## 2021-02-20 DIAGNOSIS — Z419 Encounter for procedure for purposes other than remedying health state, unspecified: Secondary | ICD-10-CM | POA: Diagnosis not present

## 2021-03-23 DIAGNOSIS — Z419 Encounter for procedure for purposes other than remedying health state, unspecified: Secondary | ICD-10-CM | POA: Diagnosis not present

## 2021-04-22 DIAGNOSIS — Z419 Encounter for procedure for purposes other than remedying health state, unspecified: Secondary | ICD-10-CM | POA: Diagnosis not present

## 2021-05-23 DIAGNOSIS — Z419 Encounter for procedure for purposes other than remedying health state, unspecified: Secondary | ICD-10-CM | POA: Diagnosis not present

## 2021-06-22 DIAGNOSIS — Z419 Encounter for procedure for purposes other than remedying health state, unspecified: Secondary | ICD-10-CM | POA: Diagnosis not present

## 2021-07-23 DIAGNOSIS — Z419 Encounter for procedure for purposes other than remedying health state, unspecified: Secondary | ICD-10-CM | POA: Diagnosis not present

## 2021-08-20 ENCOUNTER — Telehealth: Payer: Self-pay | Admitting: Obstetrics and Gynecology

## 2021-08-20 DIAGNOSIS — F902 Attention-deficit hyperactivity disorder, combined type: Secondary | ICD-10-CM

## 2021-08-20 NOTE — Telephone Encounter (Signed)
Pt seeing therapist in [place] for many yrs for ADD. Was getting Rx and therapy but lost insurance and now on [facility];  they no longer take her insurance. Was paying OOP for appts. Provider Rxd meds but since not a [facility] provider, insurance won't cover her Rx. She needs someone to Rx her 2 ADD meds and xanax sparingly. She will cont to see therapist for mgmt.  Instructed pt to have provider fax me meds and I will send in Rx to see if can get covered. Pt will cont to see therapist. Pt aware we don't manage these Rxs and this is "favor" to her since her sx are controlled with med mgmt.  Also suggested pt get PCP for mgmt if this doesn't work. Pt agrees.

## 2021-08-23 DIAGNOSIS — Z419 Encounter for procedure for purposes other than remedying health state, unspecified: Secondary | ICD-10-CM | POA: Diagnosis not present

## 2021-08-23 MED ORDER — ALPRAZOLAM 0.5 MG PO TABS: 0.5000 mg | ORAL_TABLET | Freq: Two times a day (BID) | ORAL | 0 refills | Status: DC | PRN

## 2021-08-23 MED ORDER — LISDEXAMFETAMINE DIMESYLATE 40 MG PO CAPS: 40.0000 mg | ORAL_CAPSULE | Freq: Every day | ORAL | 0 refills | Status: DC

## 2021-08-23 NOTE — Telephone Encounter (Signed)
I received notes from pt's MD in [place]. Pt on xanax 0.5 mg BID prn sx, vyvanse 40 mg in afternoon and mydayis 50 mg daily. Pt needs xanax and vyvanse Rx from provider in-network with her MCD in order to be covered, so I Rx'd meds for pt for 90 days. Pt sees therapist Q90 days and is paying OOP; can't afford meds OOP. F/u prn.

## 2021-08-23 NOTE — Addendum Note (Signed)
Addended by: Althea Grimmer B on: 08/23/2021 10:30 AM   Modules accepted: Orders

## 2021-09-11 ENCOUNTER — Other Ambulatory Visit: Payer: Self-pay | Admitting: Obstetrics and Gynecology

## 2021-09-11 DIAGNOSIS — F902 Attention-deficit hyperactivity disorder, combined type: Secondary | ICD-10-CM

## 2021-09-11 MED ORDER — ALPRAZOLAM 0.5 MG PO TABS: 0.5000 mg | ORAL_TABLET | Freq: Two times a day (BID) | ORAL | 1 refills | Status: DC | PRN

## 2021-09-11 MED ORDER — LISDEXAMFETAMINE DIMESYLATE 40 MG PO CAPS: 40.0000 mg | ORAL_CAPSULE | Freq: Every day | ORAL | 0 refills | Status: DC

## 2021-09-11 NOTE — Telephone Encounter (Signed)
Rx RF eRxd. Vyvanse is #30 and xanax is #60. To clarify, xanax Rx from psych says BID prn sx, so pt should see if she can get away with 1 daily since they're highly addictive and habit forming.

## 2021-09-11 NOTE — Telephone Encounter (Signed)
Pt aware.

## 2021-09-11 NOTE — Progress Notes (Signed)
Rx adjustment for vyvanse and xanax in order to be filled correctly.

## 2021-09-11 NOTE — Telephone Encounter (Signed)
Pt is having issues with vyvanse and xanax Rxs. Vyvanse needs to be sent in monthly supply form since its a controlled substance. Of the 90 day supply Rx sent, pt only got 30 the first time, needs the next month filled but pharmacy is asking for Rx to be in monthly supply. And for the xanax, patient takes 2 tablets daily and Rx you sent was only 30 tablets (that's 15 day supply). Pt needs the other half. All resent to [employer].

## 2021-09-22 DIAGNOSIS — Z419 Encounter for procedure for purposes other than remedying health state, unspecified: Secondary | ICD-10-CM | POA: Diagnosis not present

## 2021-10-18 ENCOUNTER — Other Ambulatory Visit: Payer: Self-pay | Admitting: Obstetrics and Gynecology

## 2021-10-18 ENCOUNTER — Telehealth: Payer: Self-pay

## 2021-10-18 DIAGNOSIS — F902 Attention-deficit hyperactivity disorder, combined type: Secondary | ICD-10-CM

## 2021-10-18 MED ORDER — LISDEXAMFETAMINE DIMESYLATE 50 MG PO CAPS: 50.0000 mg | ORAL_CAPSULE | Freq: Every day | ORAL | 0 refills | Status: DC

## 2021-10-18 MED ORDER — ALPRAZOLAM 0.5 MG PO TABS: 0.5000 mg | ORAL_TABLET | Freq: Two times a day (BID) | ORAL | 2 refills | Status: DC | PRN

## 2021-10-18 MED ORDER — LISDEXAMFETAMINE DIMESYLATE 50 MG PO CAPS
50.0000 mg | ORAL_CAPSULE | Freq: Every day | ORAL | 0 refills | Status: DC
Start: 2021-10-18 — End: 2021-12-27

## 2021-10-18 NOTE — Telephone Encounter (Signed)
Pt wants to know if you received fax from Dr Carmela Hurt about her vyvanse dose change recommendation (upped to 50 mg).

## 2021-10-18 NOTE — Progress Notes (Signed)
Rx RF vyvanse; dose increase to 50 mg per psych. I am RF meds per psych mgmt due to pt's insurance requirements.

## 2021-10-18 NOTE — Progress Notes (Signed)
Rx RF xanax, taken BID per psych mgmt. 10/02/21 Notes reviewed from [place] in [place].

## 2021-10-18 NOTE — Telephone Encounter (Signed)
Pt calling to see if we have recv'd med change from her other doctor.  [phone]  Pt aware vyvance has been eRx'd to reflect the increase in dose and xanax was send in as well; they were sent in separately.

## 2021-10-18 NOTE — Telephone Encounter (Signed)
Pt aware.

## 2021-10-18 NOTE — Telephone Encounter (Signed)
Yes, Rxs RF for 3 months with increased vyvanse dose.

## 2021-10-23 DIAGNOSIS — Z419 Encounter for procedure for purposes other than remedying health state, unspecified: Secondary | ICD-10-CM | POA: Diagnosis not present

## 2021-11-22 DIAGNOSIS — Z419 Encounter for procedure for purposes other than remedying health state, unspecified: Secondary | ICD-10-CM | POA: Diagnosis not present

## 2021-12-23 DIAGNOSIS — Z419 Encounter for procedure for purposes other than remedying health state, unspecified: Secondary | ICD-10-CM | POA: Diagnosis not present

## 2021-12-26 ENCOUNTER — Telehealth: Payer: Self-pay

## 2021-12-26 NOTE — Telephone Encounter (Signed)
This encounter was created in error - please disregard.

## 2021-12-26 NOTE — Telephone Encounter (Signed)
Pt calling about an rx that is written thru another doctor and goes to [facility] to actually rx.  [phone]  Pt has lost ins and has new ins that will cover, has applied for tacada to help financial; bottom line - would you refill her vyvance, mydayis (she took the last one today) and alprazolam?  Pharm correct in chart.

## 2021-12-27 ENCOUNTER — Other Ambulatory Visit: Payer: Self-pay | Admitting: Obstetrics and Gynecology

## 2021-12-27 DIAGNOSIS — F902 Attention-deficit hyperactivity disorder, combined type: Secondary | ICD-10-CM

## 2021-12-27 MED ORDER — ALPRAZOLAM 0.5 MG PO TABS: 0.5000 mg | ORAL_TABLET | Freq: Two times a day (BID) | ORAL | 0 refills | Status: DC | PRN

## 2021-12-27 MED ORDER — MYDAYIS 50 MG PO CP24: 50.0000 mg | ORAL_CAPSULE | Freq: Every day | ORAL | 0 refills | Status: DC

## 2021-12-27 MED ORDER — LISDEXAMFETAMINE DIMESYLATE 50 MG PO CAPS: 50.0000 mg | ORAL_CAPSULE | Freq: Every day | ORAL | 0 refills | Status: DC

## 2021-12-27 NOTE — Telephone Encounter (Signed)
Rxs  RF for 1 mo until pt comes for annual 1/23.

## 2021-12-27 NOTE — Telephone Encounter (Signed)
Pt aware.

## 2021-12-27 NOTE — Progress Notes (Signed)
Rx RF on psych meds till annual. Pt being followed by psych but psych is not in [facility] network. MCD provider needs to Rx meds in order for them to be covered.   Meds ordered this encounter  Medications   ALPRAZolam (XANAX) 0.5 MG tablet    Sig: Take 1 tablet (0.5 mg total) by mouth 2 (two) times daily as needed for anxiety.    Dispense:  60 tablet    Refill:  0    Order Specific Question:   Supervising Provider    Answer:   Nadara Mustard [[id number]]   Amphet-Dextroamphet 3-Bead ER (MYDAYIS) 50 MG CP24    Sig: Take 50 mg by mouth daily.    Dispense:  30 capsule    Refill:  0    Order Specific Question:   Supervising Provider    Answer:   Nadara Mustard [[id number]]   lisdexamfetamine (VYVANSE) 50 MG capsule    Sig: Take 1 capsule (50 mg total) by mouth daily. Can RF after 01/18/22    Dispense:  30 capsule    Refill:  0    Order Specific Question:   Supervising Provider    Answer:   Nadara Mustard [[id number]]   Pt usually gets pt assistance program for mydayis but having to apply again. Took last pill today. Rx RF this month.

## 2022-01-21 ENCOUNTER — Ambulatory Visit (INDEPENDENT_AMBULATORY_CARE_PROVIDER_SITE_OTHER): Payer: Medicaid Other | Admitting: Obstetrics and Gynecology

## 2022-01-21 ENCOUNTER — Encounter: Payer: Self-pay | Admitting: Obstetrics and Gynecology

## 2022-01-21 ENCOUNTER — Other Ambulatory Visit: Payer: Self-pay

## 2022-01-21 VITALS — BP 100/70 | Ht 67.0 in | Wt 161.0 lb

## 2022-01-21 DIAGNOSIS — Z0000 Encounter for general adult medical examination without abnormal findings: Secondary | ICD-10-CM

## 2022-01-21 DIAGNOSIS — Z30431 Encounter for routine checking of intrauterine contraceptive device: Secondary | ICD-10-CM

## 2022-01-21 DIAGNOSIS — N921 Excessive and frequent menstruation with irregular cycle: Secondary | ICD-10-CM | POA: Diagnosis not present

## 2022-01-21 DIAGNOSIS — Z01419 Encounter for gynecological examination (general) (routine) without abnormal findings: Secondary | ICD-10-CM

## 2022-01-21 DIAGNOSIS — Z1231 Encounter for screening mammogram for malignant neoplasm of breast: Secondary | ICD-10-CM

## 2022-01-21 DIAGNOSIS — Z3202 Encounter for pregnancy test, result negative: Secondary | ICD-10-CM

## 2022-01-21 DIAGNOSIS — Z975 Presence of (intrauterine) contraceptive device: Secondary | ICD-10-CM | POA: Diagnosis not present

## 2022-01-21 DIAGNOSIS — F902 Attention-deficit hyperactivity disorder, combined type: Secondary | ICD-10-CM

## 2022-01-21 LAB — POCT URINE PREGNANCY: Preg Test, Ur: NEGATIVE

## 2022-01-21 MED ORDER — MYDAYIS 50 MG PO CP24: 50.0000 mg | ORAL_CAPSULE | Freq: Every day | ORAL | 0 refills | Status: DC

## 2022-01-21 MED ORDER — LISDEXAMFETAMINE DIMESYLATE 50 MG PO CAPS: 50.0000 mg | ORAL_CAPSULE | Freq: Every day | ORAL | 0 refills | Status: DC

## 2022-01-21 MED ORDER — ALPRAZOLAM 0.5 MG PO TABS: 0.5000 mg | ORAL_TABLET | Freq: Two times a day (BID) | ORAL | 2 refills | Status: DC | PRN

## 2022-01-21 NOTE — Patient Instructions (Signed)
I value your feedback and you entrusting us with your care. If you get a [facility] patient survey, I would appreciate you taking the time to let us know about your experience today. Thank you!  [facility]: [phone]  [facility]: [phone]    

## 2022-01-21 NOTE — Progress Notes (Signed)
PCP:  Marden Noble, MD   Chief Complaint  Patient presents with   Gynecologic Exam    No concerns     HPI:      Brianna Perkins is a 41 y.o. No obstetric history on file. whose LMP was Patient's last menstrual period was 12/31/2021 (exact date)., presents today for her annual examination.  Her menses are absent with IUD. Has occas light bleeding or spotting randomly, had 5-6 day light period 1/23, amenorrheic prior to this. No dysmen.   Sex activity: single partner, contraception - IUD. Mirena placed 10/29/2018. No dyspareunia/bleeding. Last Pap: 01/16/21 Results were normal/neg HPV DNA; 09/29/18, 01/30/17,01/22/16, 11/01/14 pap results were neg per reviewed med records. Don't see any HPV testing results; hx of abn in college with repeats. No tx done.  Hx of STDs: HPV  Last mammogram: never; has implants bilat. There is no FH of breast cancer. There is no FH of ovarian cancer. The patient does do self-breast exams.  Tobacco use: The patient denies current or previous tobacco use. Alcohol use: few drinks wkly No drug use.  Exercise: moderately active  She does get adequate calcium and Vitamin D in her diet. Labs with PCP   Hx of low platelets, eval by hematology in past without cause. No f/u needed per pt. Occas has gum bleeding and bruises easily.  Pt seeing psych for ADD, meds Rxd by me for insurance coverage since pt has MCD and her psych MD is out of network for MCD. MD sends me copies of her notes and manages pt's current tx regimen, I just send in Rx RF. Pt knows this is a courtesy to her since we don't manage these meds.  Seeing psych now Q 82months, was Q3 months but doing well on med regimen. Meds can only be Rxd 3 months at a time. Pt last saw provider about 2 months ago.  Past Medical History:  Diagnosis Date   Platelets decreased ([place])     Past Surgical History:  Procedure Laterality Date   BREAST SURGERY     BUNIONECTOMY     bilateral   COSMETIC SURGERY      NECK SURGERY      Family History  Problem Relation Age of Onset   Prostate cancer Father        29s   Leukemia Paternal Uncle    Prostate cancer Paternal Uncle        84s (identical twin to dad)   Breast cancer Neg Hx     Social History   Socioeconomic History   Marital status: Legally Separated    Spouse name: Not on file   Number of children: Not on file   Years of education: Not on file   Highest education level: Not on file  Occupational History   Not on file  Tobacco Use   Smoking status: Never   Smokeless tobacco: Never  Vaping Use   Vaping Use: Never used  Substance and Sexual Activity   Alcohol use: Yes    Comment: socially   Drug use: No   Sexual activity: Yes    Birth control/protection: I.U.D.    Comment: Mirena  Other Topics Concern   Not on file  Social History Narrative   Not on file   Social Determinants of Health   Financial Resource Strain: Not on file  Food Insecurity: Not on file  Transportation Needs: Not on file  Physical Activity: Not on file  Stress: Not on file  Social Connections: Not on file  Intimate Partner Violence: Not on file     Current Outpatient Medications:    fluticasone (FLONASE) 50 MCG/ACT nasal spray, Place 1 spray into both nostrils daily., Disp: , Rfl:    levonorgestrel (MIRENA) 20 MCG/24HR IUD, 1 each by Intrauterine route once. 10/29/2018, Disp: , Rfl:    ALPRAZolam (XANAX) 0.5 MG tablet, Take 1 tablet (0.5 mg total) by mouth 2 (two) times daily as needed for anxiety., Disp: 60 tablet, Rfl: 2   Amphet-Dextroamphet 3-Bead ER (MYDAYIS) 50 MG CP24, Take 50 mg by mouth daily. Can RF after 03/19/22, Disp: 30 capsule, Rfl: 0   lisdexamfetamine (VYVANSE) 50 MG capsule, Take 1 capsule (50 mg total) by mouth daily. Can RF after 03/18/22, Disp: 30 capsule, Rfl: 0     ROS:  Review of Systems  Constitutional:  Negative for fatigue, fever and unexpected weight change.  Respiratory:  Negative for cough, shortness of breath  and wheezing.   Cardiovascular:  Negative for chest pain, palpitations and leg swelling.  Gastrointestinal:  Negative for blood in stool, constipation, diarrhea, nausea and vomiting.  Endocrine: Negative for cold intolerance, heat intolerance and polyuria.  Genitourinary:  Positive for vaginal bleeding. Negative for dyspareunia, dysuria, flank pain, frequency, genital sores, hematuria, menstrual problem, pelvic pain, urgency, vaginal discharge and vaginal pain.  Musculoskeletal:  Negative for back pain, joint swelling and myalgias.  Skin:  Negative for rash.  Neurological:  Positive for headaches. Negative for dizziness, syncope, light-headedness and numbness.  Hematological:  Negative for adenopathy.  Psychiatric/Behavioral:  Positive for agitation. Negative for confusion, sleep disturbance and suicidal ideas. The patient is not nervous/anxious.  BREAST: No symptoms   Objective: BP 100/70    Ht 5\' 7"  (1.702 m)    Wt 161 lb (73 kg)    LMP 12/31/2021 (Exact Date)    BMI 25.22 kg/m    Physical Exam Constitutional:      Appearance: She is well-developed.  Genitourinary:     Vulva normal.     Right Labia: No rash, tenderness or lesions.    Left Labia: No tenderness, lesions or rash.    No vaginal discharge, erythema or tenderness.      Right Adnexa: not tender and no mass present.    Left Adnexa: not tender and no mass present.    No cervical friability or polyp.     Uterus is not enlarged or tender.  Breasts:    Right: No mass, nipple discharge, skin change or tenderness.     Left: No mass, nipple discharge, skin change or tenderness.  Neck:     Thyroid: No thyromegaly.  Cardiovascular:     Rate and Rhythm: Normal rate and regular rhythm.     Heart sounds: Normal heart sounds. No murmur heard. Pulmonary:     Effort: Pulmonary effort is normal.     Breath sounds: Normal breath sounds.  Abdominal:     Palpations: Abdomen is soft.     Tenderness: There is no abdominal tenderness.  There is no guarding or rebound.  Musculoskeletal:        General: Normal range of motion.     Cervical back: Normal range of motion.  Lymphadenopathy:     Cervical: No cervical adenopathy.  Neurological:     General: No focal deficit present.     Mental Status: She is alert and oriented to person, place, and time.     Cranial Nerves: No cranial nerve deficit.  Skin:  General: Skin is warm and dry.  Psychiatric:        Mood and Affect: Mood normal.        Behavior: Behavior normal.        Thought Content: Thought content normal.        Judgment: Judgment normal.  Vitals reviewed.   ASSESSMENT/PLAN:  Encounter for annual routine gynecological examination  Encounter for screening mammogram for malignant neoplasm of breast - Plan: MM 3D SCREEN BREAST BILATERAL; pt to [facility]  Encounter for routine checking of intrauterine contraceptive device (IUD); IUD in place; due for removal 11/27. F/u prn bleeding.   Breakthrough bleeding with IUD - Plan: POCT urine pregnancy; reassurance. F/u prn.   Attention deficit hyperactivity disorder (ADHD), combined type - Plan: ALPRAZolam (XANAX) 0.5 MG tablet, lisdexamfetamine (VYVANSE) 50 MG capsule, Amphet-Dextroamphet 3-Bead ER (MYDAYIS) 50 MG CP24, DISCONTINUED: Amphet-Dextroamphet 3-Bead ER (MYDAYIS) 50 MG CP24, DISCONTINUED: lisdexamfetamine (VYVANSE) 50 MG capsule, DISCONTINUED: lisdexamfetamine (VYVANSE) 50 MG capsule, DISCONTINUED: Amphet-Dextroamphet 3-Bead ER (MYDAYIS) 50 MG CP24; Rx RF for 3 months. Reviewed recent notes from psych MD Rachel Moulds at [facility].   Meds ordered this encounter  Medications   DISCONTD: Amphet-Dextroamphet 3-Bead ER (MYDAYIS) 50 MG CP24    Sig: Take 50 mg by mouth daily.    Dispense:  30 capsule    Refill:  0    Order Specific Question:   Supervising Provider    Answer:   Gae Dry [id number]   ALPRAZolam (XANAX) 0.5 MG tablet    Sig: Take 1 tablet (0.5 mg total) by mouth 2  (two) times daily as needed for anxiety.    Dispense:  60 tablet    Refill:  2    Order Specific Question:   Supervising Provider    Answer:   Gae Dry [id number]   DISCONTD: lisdexamfetamine (VYVANSE) 50 MG capsule    Sig: Take 1 capsule (50 mg total) by mouth daily. Can RF after 02/18/22    Dispense:  30 capsule    Refill:  0    Order Specific Question:   Supervising Provider    Answer:   Gae Dry [id number]   DISCONTD: lisdexamfetamine (VYVANSE) 50 MG capsule    Sig: Take 1 capsule (50 mg total) by mouth daily. Can RF after 02/18/22    Dispense:  30 capsule    Refill:  0    Order Specific Question:   Supervising Provider    Answer:   Gae Dry [id number]   DISCONTD: Amphet-Dextroamphet 3-Bead ER (MYDAYIS) 50 MG CP24    Sig: Take 50 mg by mouth daily. Can RF after 02/19/22    Dispense:  30 capsule    Refill:  0    Order Specific Question:   Supervising Provider    Answer:   Gae Dry [id number]   lisdexamfetamine (VYVANSE) 50 MG capsule    Sig: Take 1 capsule (50 mg total) by mouth daily. Can RF after 03/18/22    Dispense:  30 capsule    Refill:  0    Order Specific Question:   Supervising Provider    Answer:   Gae Dry [id number]   Amphet-Dextroamphet 3-Bead ER (MYDAYIS) 50 MG CP24    Sig: Take 50 mg by mouth daily. Can RF after 03/19/22    Dispense:  30 capsule    Refill:  0    Order Specific Question:   Supervising Provider    Answer:   Gae Dry [  [id number]           GYN counsel breast self exam, mammography screening, adequate intake of calcium and vitamin D, diet and exercise     F/U  Return in about 1 year (around 01/21/2023).  Aerion Bagdasarian B. Argel Pablo, PA-C 01/21/2022 4:56 PM

## 2022-01-23 DIAGNOSIS — Z419 Encounter for procedure for purposes other than remedying health state, unspecified: Secondary | ICD-10-CM | POA: Diagnosis not present

## 2022-02-20 DIAGNOSIS — Z419 Encounter for procedure for purposes other than remedying health state, unspecified: Secondary | ICD-10-CM | POA: Diagnosis not present

## 2022-02-28 ENCOUNTER — Ambulatory Visit
Admission: RE | Admit: 2022-02-28 | Discharge: 2022-02-28 | Disposition: A | Discharge status: Home / Self Care | Payer: Medicaid Other | Source: Ambulatory Visit | Attending: Obstetrics and Gynecology | Admitting: Obstetrics and Gynecology

## 2022-02-28 ENCOUNTER — Other Ambulatory Visit: Payer: Self-pay

## 2022-02-28 DIAGNOSIS — Z1231 Encounter for screening mammogram for malignant neoplasm of breast: Secondary | ICD-10-CM | POA: Insufficient documentation

## 2022-03-23 DIAGNOSIS — Z419 Encounter for procedure for purposes other than remedying health state, unspecified: Secondary | ICD-10-CM | POA: Diagnosis not present

## 2022-04-17 ENCOUNTER — Encounter: Payer: Self-pay | Admitting: Obstetrics and Gynecology

## 2022-04-17 NOTE — Progress Notes (Signed)
Received updated records from DR. Elisabeth Most. Pt on mydayis 50mg  in AM and vyvanse 50 mg at noon. Not working as well. Mydayis BID dosing would be helpful but not affordable. Cont same dosing but take vyvanse 2-3 hrs after mydayis. Has 6 mo f/u.  ?

## 2022-04-22 DIAGNOSIS — Z419 Encounter for procedure for purposes other than remedying health state, unspecified: Secondary | ICD-10-CM | POA: Diagnosis not present

## 2022-05-15 ENCOUNTER — Telehealth: Payer: Self-pay

## 2022-05-15 NOTE — Telephone Encounter (Signed)
Received recalled letter in mail for Rx alprazolam. Called pt to f/u with this letter, says she has not heard or received anything. Advised to call pharmacy, it could be her batch was not affected.

## 2022-05-23 DIAGNOSIS — Z419 Encounter for procedure for purposes other than remedying health state, unspecified: Secondary | ICD-10-CM | POA: Diagnosis not present

## 2022-06-03 ENCOUNTER — Other Ambulatory Visit: Payer: Self-pay | Admitting: Obstetrics and Gynecology

## 2022-06-03 ENCOUNTER — Telehealth: Payer: Self-pay

## 2022-06-03 DIAGNOSIS — F902 Attention-deficit hyperactivity disorder, combined type: Secondary | ICD-10-CM

## 2022-06-03 MED ORDER — MYDAYIS 50 MG PO CP24: 50.0000 mg | ORAL_CAPSULE | Freq: Every day | ORAL | 0 refills | Status: DC

## 2022-06-03 MED ORDER — LISDEXAMFETAMINE DIMESYLATE 50 MG PO CAPS: 50.0000 mg | ORAL_CAPSULE | Freq: Every day | ORAL | 0 refills | Status: DC

## 2022-06-03 NOTE — Progress Notes (Signed)
Rx RF vyanse and mydayis for 3 months. Does f/u wih psych Q6 months but I Rx medication since psych OON with pt's insurance.

## 2022-06-03 NOTE — Telephone Encounter (Signed)
Pt called our office requesting RF's for MYDAYIS and VYVANSE. Aware [facility] out of the office today, returns tomorrow.

## 2022-06-03 NOTE — Telephone Encounter (Signed)
Rx RF eRxd. Pt sees psych but I Rx meds since psych is OON for pt's insurance. Has f/u Q6 months with psych.

## 2022-06-04 NOTE — Telephone Encounter (Signed)
Pt aware.

## 2022-06-22 DIAGNOSIS — Z419 Encounter for procedure for purposes other than remedying health state, unspecified: Secondary | ICD-10-CM | POA: Diagnosis not present

## 2022-07-23 DIAGNOSIS — Z419 Encounter for procedure for purposes other than remedying health state, unspecified: Secondary | ICD-10-CM | POA: Diagnosis not present

## 2022-08-23 DIAGNOSIS — Z419 Encounter for procedure for purposes other than remedying health state, unspecified: Secondary | ICD-10-CM | POA: Diagnosis not present

## 2022-09-06 ENCOUNTER — Encounter: Payer: Self-pay | Admitting: Obstetrics and Gynecology

## 2022-09-06 ENCOUNTER — Other Ambulatory Visit: Payer: Self-pay | Admitting: Obstetrics and Gynecology

## 2022-09-06 ENCOUNTER — Telehealth: Payer: Self-pay

## 2022-09-06 DIAGNOSIS — F902 Attention-deficit hyperactivity disorder, combined type: Secondary | ICD-10-CM

## 2022-09-06 NOTE — Telephone Encounter (Signed)
Pt calling for refills of vyvanse 50mg , xanax, and mydayis 50mg ; 90d supply to .  [phone]

## 2022-09-09 ENCOUNTER — Other Ambulatory Visit: Payer: Self-pay | Admitting: Obstetrics and Gynecology

## 2022-09-09 DIAGNOSIS — F902 Attention-deficit hyperactivity disorder, combined type: Secondary | ICD-10-CM

## 2022-09-09 MED ORDER — ALPRAZOLAM 0.5 MG PO TABS: 0.5000 mg | ORAL_TABLET | Freq: Two times a day (BID) | ORAL | 0 refills | Status: DC | PRN

## 2022-09-09 MED ORDER — LISDEXAMFETAMINE DIMESYLATE 50 MG PO CAPS: 50.0000 mg | ORAL_CAPSULE | Freq: Every day | ORAL | 0 refills | Status: DC

## 2022-09-09 MED ORDER — MYDAYIS 50 MG PO CP24: 50.0000 mg | ORAL_CAPSULE | Freq: Every day | ORAL | 0 refills | Status: DC

## 2022-09-09 NOTE — Progress Notes (Signed)
Rx RF vyvanse and mydayis for ADD, and xanax prn. Pt sees psych MD, current on appt. Next appt should be 10/23.

## 2022-09-09 NOTE — Telephone Encounter (Signed)
Done on MyChart message. 

## 2022-09-22 DIAGNOSIS — Z419 Encounter for procedure for purposes other than remedying health state, unspecified: Secondary | ICD-10-CM | POA: Diagnosis not present

## 2022-10-08 ENCOUNTER — Encounter: Payer: Self-pay | Admitting: Obstetrics and Gynecology

## 2022-10-08 NOTE — Progress Notes (Signed)
Received updated psych f/u notes from Rachel Moulds, DO from [facility]. Pt seen 09/26/22.  Continue mydayis 50 mg early AM, vyvanse 50 mg mid AM. Has Rx for alprazolam 0.5 mg BID prn. Continue meds, f/u in 6 months.  I am Rx meds for medicaid coverage, although pt assistance program for mydayis runs out end of the year. Psych to figure out different Rx option since not affordable otherwise.

## 2022-10-23 DIAGNOSIS — Z419 Encounter for procedure for purposes other than remedying health state, unspecified: Secondary | ICD-10-CM | POA: Diagnosis not present

## 2022-11-18 ENCOUNTER — Other Ambulatory Visit: Payer: Self-pay

## 2022-11-18 DIAGNOSIS — F902 Attention-deficit hyperactivity disorder, combined type: Secondary | ICD-10-CM

## 2022-11-18 MED ORDER — ALPRAZOLAM 0.5 MG PO TABS: 0.5000 mg | ORAL_TABLET | Freq: Two times a day (BID) | ORAL | 3 refills | Status: DC | PRN

## 2022-11-18 NOTE — Telephone Encounter (Signed)
Patient calling in to request refills of Mydayis (generic) and Vyvanse for 3 months. Xanax was sent to patient prefrence, Karin Golden on [address] in [place]. Please advise

## 2022-11-18 NOTE — Telephone Encounter (Signed)
Xanax unable to be e-scribed, she needs a 3 month supply of this as well

## 2022-11-19 MED ORDER — ALPRAZOLAM 0.5 MG PO TABS: 0.5000 mg | ORAL_TABLET | Freq: Two times a day (BID) | ORAL | 0 refills | Status: DC | PRN

## 2022-11-19 NOTE — Telephone Encounter (Signed)
Rx RF to take prn. Current on psych MD appt, notes in chart.

## 2022-11-19 NOTE — Addendum Note (Signed)
Addended by: Althea Grimmer B on: 11/19/2022 09:43 AM   Modules accepted: Orders

## 2022-11-20 ENCOUNTER — Other Ambulatory Visit: Payer: Self-pay

## 2022-11-20 DIAGNOSIS — F902 Attention-deficit hyperactivity disorder, combined type: Secondary | ICD-10-CM

## 2022-11-21 ENCOUNTER — Other Ambulatory Visit: Payer: Self-pay | Admitting: Obstetrics and Gynecology

## 2022-11-21 ENCOUNTER — Telehealth: Payer: Self-pay

## 2022-11-21 ENCOUNTER — Encounter: Payer: Self-pay | Admitting: Obstetrics and Gynecology

## 2022-11-21 DIAGNOSIS — F902 Attention-deficit hyperactivity disorder, combined type: Secondary | ICD-10-CM

## 2022-11-21 MED ORDER — LISDEXAMFETAMINE DIMESYLATE 50 MG PO CAPS: 50.0000 mg | ORAL_CAPSULE | Freq: Every day | ORAL | 0 refills | Status: DC

## 2022-11-21 MED ORDER — AMPHET-DEXTROAMPHET 3-BEAD ER 50 MG PO CP24: 50.0000 mg | ORAL_CAPSULE | Freq: Every day | ORAL | 0 refills | Status: DC

## 2022-11-21 NOTE — Telephone Encounter (Signed)
Pt calling triage needing her RX switched to the [facility] in [place] that's in her chart. She said since its a controlled substance that [facility] would have to resend it in, they could not transfer it to the correct pharm.

## 2022-11-21 NOTE — Progress Notes (Signed)
Rx RF mydayis and vyvanse to [facility] [place] now, not [place].

## 2022-11-21 NOTE — Progress Notes (Signed)
Rx RF vyvanse and Mydayis. Pt current on appt with psych 10/23. Due for annual with me 1/24.

## 2022-11-21 NOTE — Telephone Encounter (Signed)
Rx RF eRxd to correct pharm. Pt notified

## 2022-11-22 DIAGNOSIS — Z419 Encounter for procedure for purposes other than remedying health state, unspecified: Secondary | ICD-10-CM | POA: Diagnosis not present

## 2022-12-23 DIAGNOSIS — Z419 Encounter for procedure for purposes other than remedying health state, unspecified: Secondary | ICD-10-CM | POA: Diagnosis not present

## 2022-12-30 ENCOUNTER — Other Ambulatory Visit: Payer: Self-pay | Admitting: Obstetrics and Gynecology

## 2022-12-30 DIAGNOSIS — Z1231 Encounter for screening mammogram for malignant neoplasm of breast: Secondary | ICD-10-CM

## 2023-01-03 ENCOUNTER — Telehealth: Payer: Self-pay

## 2023-01-03 NOTE — Telephone Encounter (Signed)
Brianna Perkins called triage that you sent in a prescription for her, the [place] 19 MG is covered by her insurance and would like for you to call that in for her.

## 2023-01-03 NOTE — Telephone Encounter (Signed)
Message was sent to [facility]

## 2023-01-05 ENCOUNTER — Other Ambulatory Visit: Payer: Self-pay | Admitting: Obstetrics and Gynecology

## 2023-01-05 DIAGNOSIS — F902 Attention-deficit hyperactivity disorder, combined type: Secondary | ICD-10-CM

## 2023-01-05 MED ORDER — AMPHET-DEXTROAMPHET 3-BEAD ER 50 MG PO CP24: 50.0000 mg | ORAL_CAPSULE | Freq: Every day | ORAL | 0 refills | Status: DC

## 2023-01-05 NOTE — Telephone Encounter (Signed)
Rx RF eRxd to [place]

## 2023-01-05 NOTE — Progress Notes (Signed)
Rx RF mydayis. Has annual 2/24

## 2023-01-06 ENCOUNTER — Encounter: Payer: Self-pay | Admitting: Obstetrics and Gynecology

## 2023-01-07 NOTE — Telephone Encounter (Signed)
Pt aware.

## 2023-01-08 ENCOUNTER — Other Ambulatory Visit: Payer: Self-pay | Admitting: Obstetrics and Gynecology

## 2023-01-08 DIAGNOSIS — F902 Attention-deficit hyperactivity disorder, combined type: Secondary | ICD-10-CM

## 2023-01-08 MED ORDER — MYDAYIS 50 MG PO CP24: 50.0000 mg | ORAL_CAPSULE | Freq: Every day | ORAL | 0 refills | Status: DC

## 2023-01-08 NOTE — Progress Notes (Signed)
Rx RF mydayis brand only. See note about pt not being able to get at pharm. Has annual 2/24

## 2023-01-09 NOTE — Telephone Encounter (Signed)
PA approved. Pt aware.

## 2023-01-23 DIAGNOSIS — Z419 Encounter for procedure for purposes other than remedying health state, unspecified: Secondary | ICD-10-CM | POA: Diagnosis not present

## 2023-01-27 NOTE — Progress Notes (Unsigned)
PCP:  Pcp, No   No chief complaint on file.    HPI:      Ms. Blaklee R Gillingham is a 42 y.o. No obstetric history on file. whose LMP was No LMP recorded. (Menstrual status: IUD)., presents today for her annual examination.  Her menses are absent with IUD. Has occas light bleeding or spotting randomly, had 5-6 day light period 1/23, amenorrheic prior to this. No dysmen.   Sex activity: single partner, contraception - IUD. Mirena placed 10/29/2018. No dyspareunia/bleeding. Last Pap: 01/16/21 Results were normal/neg HPV DNA; 09/29/18, 01/30/17,01/22/16, 11/01/14 pap results were neg per reviewed med records. Don't see any HPV testing results; hx of abn in college with repeats. No tx done.  Hx of STDs: HPV  Last mammogram: 02/28/22 Results were normal, repeat in 12 months. Has appt 3/24. Has implants bilat. There is no FH of breast cancer. There is no FH of ovarian cancer. The patient does do self-breast exams.  Tobacco use: The patient denies current or previous tobacco use. Alcohol use: few drinks wkly No drug use.  Exercise: moderately active  She does get adequate calcium and Vitamin D in her diet. Labs with PCP   Hx of low platelets, eval by hematology in past without cause. No f/u needed per pt. Occas has gum bleeding and bruises easily.  Pt seeing psych for ADD, meds Rxd by me for insurance coverage since pt has MCD and her psych MD is out of network for MCD. MD sends me copies of her notes and manages pt's current tx regimen, I just send in Rx RF. Pt knows this is a courtesy to her since we don't manage these meds.  Seeing psych now Q 62months, was Q3 months but doing well on med regimen. Meds can only be Rxd 3 months at a time. Pt last saw provider about 2 months ago.***  Past Medical History:  Diagnosis Date   Platelets decreased ([place])     Past Surgical History:  Procedure Laterality Date   AUGMENTATION MAMMAPLASTY Bilateral 2001   saline/ in front of the muscle   BREAST SURGERY      BUNIONECTOMY     bilateral   COSMETIC SURGERY     NECK SURGERY      Family History  Problem Relation Age of Onset   Prostate cancer Father        91s   Leukemia Paternal Uncle    Prostate cancer Paternal Uncle        5s (identical twin to dad)   Breast cancer Neg Hx     Social History   Socioeconomic History   Marital status: Divorced    Spouse name: Not on file   Number of children: Not on file   Years of education: Not on file   Highest education level: Not on file  Occupational History   Not on file  Tobacco Use   Smoking status: Never   Smokeless tobacco: Never  Vaping Use   Vaping Use: Never used  Substance and Sexual Activity   Alcohol use: Yes    Comment: socially   Drug use: No   Sexual activity: Yes    Birth control/protection: I.U.D.    Comment: Mirena  Other Topics Concern   Not on file  Social History Narrative   Not on file   Social Determinants of Health   Financial Resource Strain: Not on file  Food Insecurity: Not on file  Transportation Needs: Not on file  Physical Activity:  Not on file  Stress: Not on file  Social Connections: Not on file  Intimate Partner Violence: Not on file     Current Outpatient Medications:    ALPRAZolam (XANAX) 0.5 MG tablet, Take 1 tablet (0.5 mg total) by mouth 2 (two) times daily as needed for anxiety., Disp: 60 tablet, Rfl: 0   fluticasone (FLONASE) 50 MCG/ACT nasal spray, Place 1 spray into both nostrils daily., Disp: , Rfl:    levonorgestrel (MIRENA) 20 MCG/24HR IUD, 1 each by Intrauterine route once. 10/29/2018, Disp: , Rfl:    lisdexamfetamine (VYVANSE) 50 MG capsule, Take 1 capsule (50 mg total) by mouth daily. Can RF after 12/21/22, Disp: 30 capsule, Rfl: 0   MYDAYIS 50 MG CP24, Take 50 mg by mouth daily. BRAND ONLY, Disp: 30 capsule, Rfl: 0     ROS:  Review of Systems  Constitutional:  Negative for fatigue, fever and unexpected weight change.  Respiratory:  Negative for cough, shortness of  breath and wheezing.   Cardiovascular:  Negative for chest pain, palpitations and leg swelling.  Gastrointestinal:  Negative for blood in stool, constipation, diarrhea, nausea and vomiting.  Endocrine: Negative for cold intolerance, heat intolerance and polyuria.  Genitourinary:  Positive for vaginal bleeding. Negative for dyspareunia, dysuria, flank pain, frequency, genital sores, hematuria, menstrual problem, pelvic pain, urgency, vaginal discharge and vaginal pain.  Musculoskeletal:  Negative for back pain, joint swelling and myalgias.  Skin:  Negative for rash.  Neurological:  Positive for headaches. Negative for dizziness, syncope, light-headedness and numbness.  Hematological:  Negative for adenopathy.  Psychiatric/Behavioral:  Positive for agitation. Negative for confusion, sleep disturbance and suicidal ideas. The patient is not nervous/anxious.   BREAST: No symptoms   Objective: There were no vitals taken for this visit.   Physical Exam Constitutional:      Appearance: She is well-developed.  Genitourinary:     Vulva normal.     Right Labia: No rash, tenderness or lesions.    Left Labia: No tenderness, lesions or rash.    No vaginal discharge, erythema or tenderness.      Right Adnexa: not tender and no mass present.    Left Adnexa: not tender and no mass present.    No cervical friability or polyp.     Uterus is not enlarged or tender.  Breasts:    Right: No mass, nipple discharge, skin change or tenderness.     Left: No mass, nipple discharge, skin change or tenderness.  Neck:     Thyroid: No thyromegaly.  Cardiovascular:     Rate and Rhythm: Normal rate and regular rhythm.     Heart sounds: Normal heart sounds. No murmur heard. Pulmonary:     Effort: Pulmonary effort is normal.     Breath sounds: Normal breath sounds.  Abdominal:     Palpations: Abdomen is soft.     Tenderness: There is no abdominal tenderness. There is no guarding or rebound.  Musculoskeletal:         General: Normal range of motion.     Cervical back: Normal range of motion.  Lymphadenopathy:     Cervical: No cervical adenopathy.  Neurological:     General: No focal deficit present.     Mental Status: She is alert and oriented to person, place, and time.     Cranial Nerves: No cranial nerve deficit.  Skin:    General: Skin is warm and dry.  Psychiatric:        Mood and Affect: Mood  normal.        Behavior: Behavior normal.        Thought Content: Thought content normal.        Judgment: Judgment normal.  Vitals reviewed.    ASSESSMENT/PLAN:  Encounter for annual routine gynecological examination  Encounter for screening mammogram for malignant neoplasm of breast - Plan: MM 3D SCREEN BREAST BILATERAL; pt to [facility]  Encounter for routine checking of intrauterine contraceptive device (IUD); IUD in place; due for removal 11/27. F/u prn bleeding.   Breakthrough bleeding with IUD - Plan: POCT urine pregnancy; reassurance. F/u prn.   Attention deficit hyperactivity disorder (ADHD), combined type - Plan: ALPRAZolam (XANAX) 0.5 MG tablet, lisdexamfetamine (VYVANSE) 50 MG capsule, Amphet-Dextroamphet 3-Bead ER (MYDAYIS) 50 MG CP24, DISCONTINUED: Amphet-Dextroamphet 3-Bead ER (MYDAYIS) 50 MG CP24, DISCONTINUED: lisdexamfetamine (VYVANSE) 50 MG capsule, DISCONTINUED: lisdexamfetamine (VYVANSE) 50 MG capsule, DISCONTINUED: Amphet-Dextroamphet 3-Bead ER (MYDAYIS) 50 MG CP24; Rx RF for 3 months. Reviewed recent notes from psych MD Rachel Moulds at [facility].   No orders of the defined types were placed in this encounter.          GYN counsel breast self exam, mammography screening, adequate intake of calcium and vitamin D, diet and exercise     F/U  No follow-ups on file.  Lavone Barrientes B. Jacarius Handel, PA-C 01/27/2023 5:03 PM

## 2023-01-28 ENCOUNTER — Encounter: Payer: Self-pay | Admitting: Obstetrics and Gynecology

## 2023-01-28 ENCOUNTER — Ambulatory Visit (INDEPENDENT_AMBULATORY_CARE_PROVIDER_SITE_OTHER): Payer: Medicaid Other | Admitting: Obstetrics and Gynecology

## 2023-01-28 VITALS — BP 110/64 | Ht 67.0 in | Wt 174.0 lb

## 2023-01-28 DIAGNOSIS — Z975 Presence of (intrauterine) contraceptive device: Secondary | ICD-10-CM

## 2023-01-28 DIAGNOSIS — F902 Attention-deficit hyperactivity disorder, combined type: Secondary | ICD-10-CM | POA: Diagnosis not present

## 2023-01-28 DIAGNOSIS — Z1231 Encounter for screening mammogram for malignant neoplasm of breast: Secondary | ICD-10-CM

## 2023-01-28 DIAGNOSIS — Z01419 Encounter for gynecological examination (general) (routine) without abnormal findings: Secondary | ICD-10-CM

## 2023-01-28 DIAGNOSIS — Z30431 Encounter for routine checking of intrauterine contraceptive device: Secondary | ICD-10-CM

## 2023-01-28 DIAGNOSIS — N921 Excessive and frequent menstruation with irregular cycle: Secondary | ICD-10-CM

## 2023-01-28 MED ORDER — LISDEXAMFETAMINE DIMESYLATE 50 MG PO CAPS: 50.0000 mg | ORAL_CAPSULE | Freq: Every day | ORAL | 0 refills | Status: DC

## 2023-01-28 MED ORDER — MYDAYIS 50 MG PO CP24: 50.0000 mg | ORAL_CAPSULE | Freq: Every day | ORAL | 0 refills | Status: DC

## 2023-01-28 MED ORDER — ALPRAZOLAM 0.5 MG PO TABS: 0.5000 mg | ORAL_TABLET | Freq: Two times a day (BID) | ORAL | 0 refills | Status: AC | PRN

## 2023-01-28 NOTE — Patient Instructions (Signed)
I value your feedback and you entrusting us with your care. If you get a [facility] patient survey, I would appreciate you taking the time to let us know about your experience today. Thank you! ? ? ?

## 2023-02-07 NOTE — Telephone Encounter (Signed)
Received fax from pharmacy to start PA for Mydayis 50 mg. Reached out to pt to make sure no PA needed and she said she called her insurance yesterday and was advised she does not need new PA. She will reach out if she has issues.

## 2023-02-11 DIAGNOSIS — M25562 Pain in left knee: Secondary | ICD-10-CM | POA: Diagnosis not present

## 2023-02-21 DIAGNOSIS — Z419 Encounter for procedure for purposes other than remedying health state, unspecified: Secondary | ICD-10-CM | POA: Diagnosis not present

## 2023-03-03 ENCOUNTER — Ambulatory Visit
Admission: RE | Admit: 2023-03-03 | Discharge: 2023-03-03 | Disposition: A | Discharge status: Home / Self Care | Payer: Medicaid Other | Source: Ambulatory Visit

## 2023-03-03 DIAGNOSIS — Z1231 Encounter for screening mammogram for malignant neoplasm of breast: Secondary | ICD-10-CM

## 2023-03-24 DIAGNOSIS — Z419 Encounter for procedure for purposes other than remedying health state, unspecified: Secondary | ICD-10-CM | POA: Diagnosis not present

## 2023-04-01 ENCOUNTER — Encounter: Payer: Self-pay | Admitting: Obstetrics and Gynecology

## 2023-04-01 NOTE — Progress Notes (Signed)
Pt saw Dr. Elisabeth Most at [facility] for her Q6 month f/u on 03/27/23. Pt to cont mydayis 50 mg at 6 AM and vyvanse 50 mg at 10 AM. Since this regimen doesn't manage her afternoon sx optimally, Dr. Elisabeth Most recommended trying vyvanse 60 mg to see if this works better. Could also do "Mydayis BID but that would probably require a PA and likely would need 2 different doses for coverage."

## 2023-04-07 ENCOUNTER — Encounter: Payer: Self-pay | Admitting: Obstetrics and Gynecology

## 2023-04-08 ENCOUNTER — Other Ambulatory Visit: Payer: Self-pay | Admitting: Obstetrics and Gynecology

## 2023-04-08 DIAGNOSIS — F902 Attention-deficit hyperactivity disorder, combined type: Secondary | ICD-10-CM

## 2023-04-08 MED ORDER — MYDAYIS 50 MG PO CP24: 50.0000 mg | ORAL_CAPSULE | Freq: Every day | ORAL | 0 refills | Status: DC

## 2023-04-08 MED ORDER — LISDEXAMFETAMINE DIMESYLATE 60 MG PO CAPS: 60.0000 mg | ORAL_CAPSULE | Freq: Every day | ORAL | 0 refills | Status: DC

## 2023-04-08 NOTE — Progress Notes (Signed)
Rx RF mydayis for 3 months. Pt's notes from psych 4/24 reviewed and documented. Rx vyvanse dose increased to 60 mg from 50 mg per psych notes.

## 2023-04-16 ENCOUNTER — Telehealth: Payer: Self-pay

## 2023-04-16 ENCOUNTER — Other Ambulatory Visit: Payer: Self-pay | Admitting: Obstetrics and Gynecology

## 2023-04-16 DIAGNOSIS — F902 Attention-deficit hyperactivity disorder, combined type: Secondary | ICD-10-CM

## 2023-04-16 MED ORDER — LISDEXAMFETAMINE DIMESYLATE 60 MG PO CAPS: 60.0000 mg | ORAL_CAPSULE | Freq: Every day | ORAL | 0 refills | Status: DC

## 2023-04-16 NOTE — Telephone Encounter (Signed)
Pt called after hour nurse 04/16/23 11:13am stating she would like to talk to Dr. Cyndie Chime nurse.. [phone]

## 2023-04-16 NOTE — Progress Notes (Signed)
Rx RF vyvanse to pick up today

## 2023-04-17 NOTE — Telephone Encounter (Signed)
Called pt, no answer, LVMTRC. 

## 2023-04-20 ENCOUNTER — Other Ambulatory Visit: Payer: Self-pay | Admitting: Obstetrics and Gynecology

## 2023-04-20 DIAGNOSIS — F902 Attention-deficit hyperactivity disorder, combined type: Secondary | ICD-10-CM

## 2023-04-20 MED ORDER — LISDEXAMFETAMINE DIMESYLATE 60 MG PO CAPS: 60.0000 mg | ORAL_CAPSULE | Freq: Every day | ORAL | 0 refills | Status: DC

## 2023-04-20 NOTE — Progress Notes (Signed)
Rx RF vyvanse 60 mg. Rx didn't go through to pharmacy last wk when I was not in the office.

## 2023-04-23 DIAGNOSIS — Z419 Encounter for procedure for purposes other than remedying health state, unspecified: Secondary | ICD-10-CM | POA: Diagnosis not present

## 2023-05-24 DIAGNOSIS — Z419 Encounter for procedure for purposes other than remedying health state, unspecified: Secondary | ICD-10-CM | POA: Diagnosis not present

## 2023-06-23 DIAGNOSIS — Z419 Encounter for procedure for purposes other than remedying health state, unspecified: Secondary | ICD-10-CM | POA: Diagnosis not present

## 2023-07-10 ENCOUNTER — Other Ambulatory Visit: Payer: Self-pay | Admitting: Obstetrics and Gynecology

## 2023-07-10 DIAGNOSIS — F902 Attention-deficit hyperactivity disorder, combined type: Secondary | ICD-10-CM

## 2023-07-14 ENCOUNTER — Other Ambulatory Visit: Payer: Self-pay | Admitting: Obstetrics and Gynecology

## 2023-07-15 ENCOUNTER — Other Ambulatory Visit: Payer: Self-pay | Admitting: Obstetrics and Gynecology

## 2023-07-15 DIAGNOSIS — F902 Attention-deficit hyperactivity disorder, combined type: Secondary | ICD-10-CM

## 2023-07-15 MED ORDER — MYDAYIS 50 MG PO CP24
50.0000 mg | ORAL_CAPSULE | Freq: Every day | ORAL | 0 refills | Status: DC
Start: 2023-07-15 — End: 2023-07-15

## 2023-07-15 MED ORDER — VYVANSE 60 MG PO CAPS
60.0000 mg | ORAL_CAPSULE | ORAL | 0 refills | Status: DC
Start: 2023-07-15 — End: 2023-07-15

## 2023-07-15 MED ORDER — VYVANSE 60 MG PO CAPS
60.0000 mg | ORAL_CAPSULE | ORAL | 0 refills | Status: DC
Start: 2023-07-15 — End: 2023-11-27

## 2023-07-15 MED ORDER — MYDAYIS 50 MG PO CP24
50.0000 mg | ORAL_CAPSULE | Freq: Every day | ORAL | 0 refills | Status: DC
Start: 2023-07-15 — End: 2023-11-27

## 2023-07-15 NOTE — Progress Notes (Signed)
Rx RF mydayis 50 mg every day and Vyvanse 60 mg BRAND only for 3 months. Sees psych Q6 months who sends me notes. Last seen 4/24.

## 2023-07-24 DIAGNOSIS — Z419 Encounter for procedure for purposes other than remedying health state, unspecified: Secondary | ICD-10-CM | POA: Diagnosis not present

## 2023-08-01 IMAGING — MG DIGITAL SCREENING BREAST BILAT IMPLANT W/ TOMO W/ CAD
8 of 16 series · 8 of 40 positions shown · non-contrast
Comparison: Previous exam(s).

CLINICAL DATA: Screening.

EXAM:
DIGITAL SCREENING BILATERAL MAMMOGRAM WITH IMPLANTS, CAD AND
TOMOSYNTHESIS
TECHNIQUE: Bilateral screening digital craniocaudal and mediolateral oblique
mammograms were obtained. Bilateral screening digital breast
tomosynthesis was performed. The images were evaluated with
computer-aided detection. Standard and/or implant displaced views
were performed.

[R MLO]
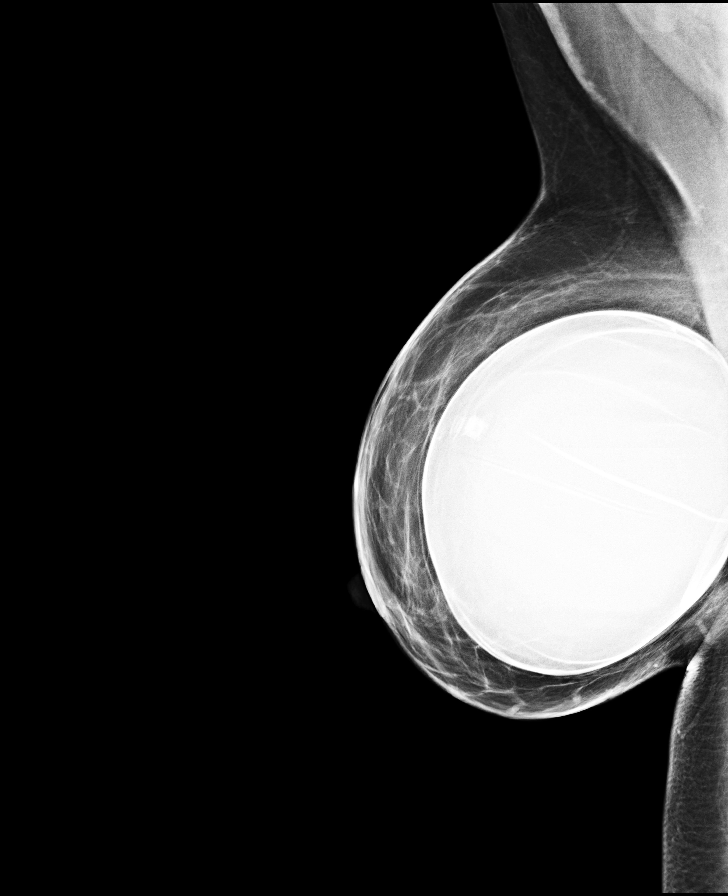

[L MLO]
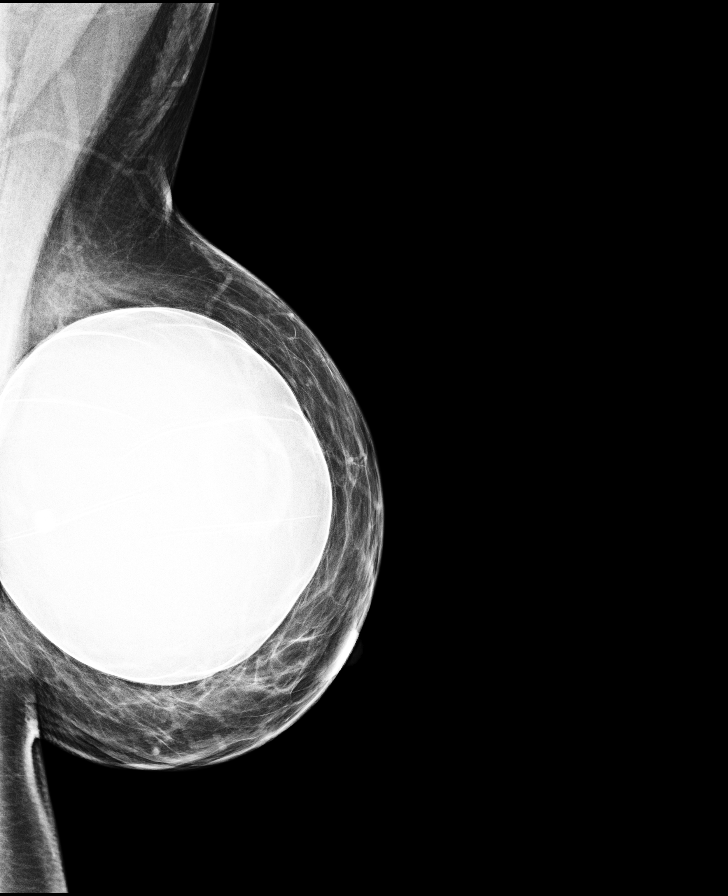

[R CC]
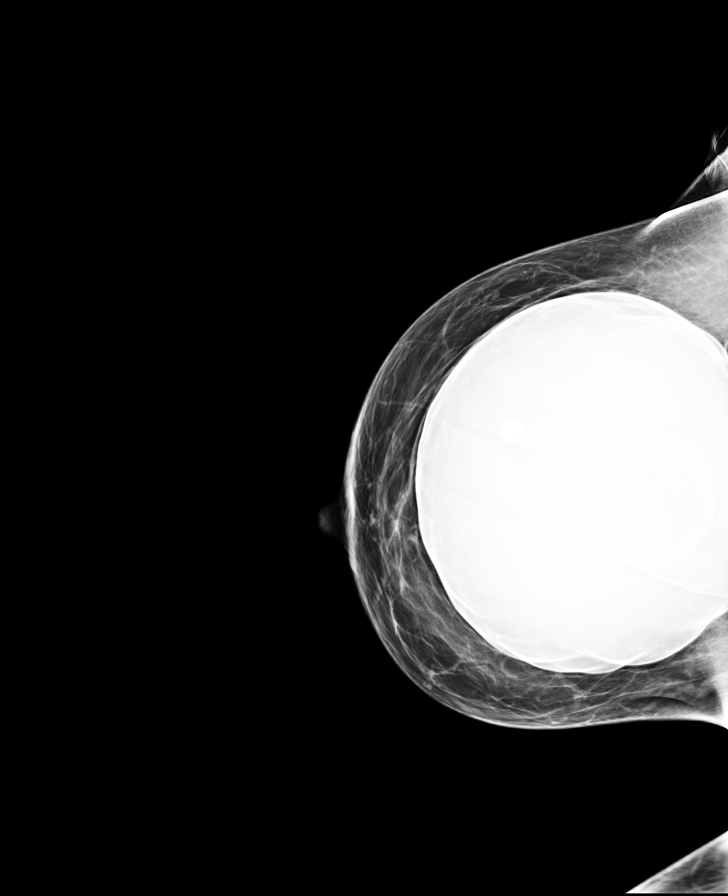

[L CC]
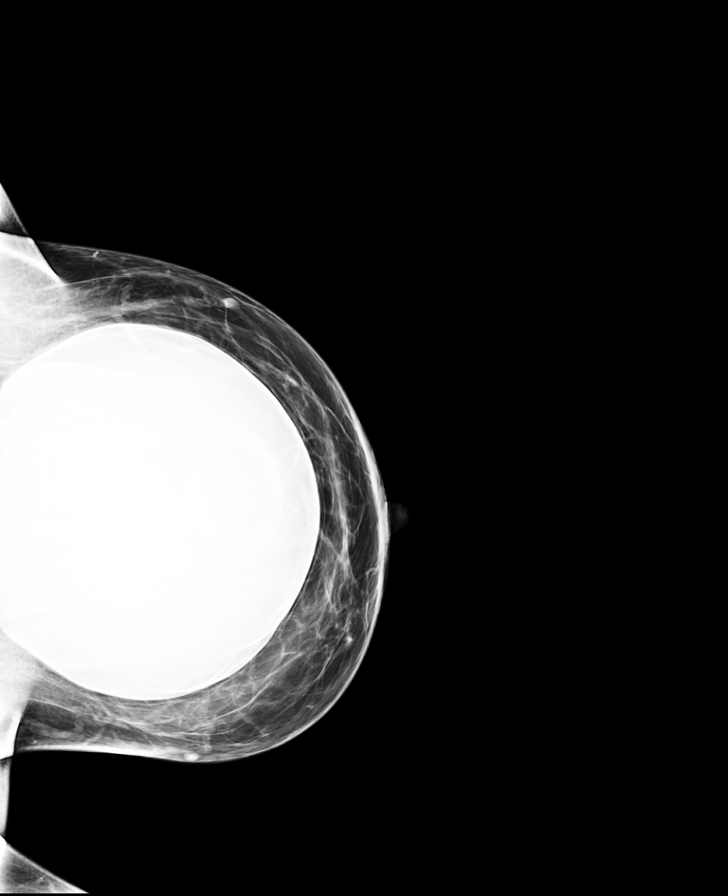

[L MLO synth-2D (1 of 2)]
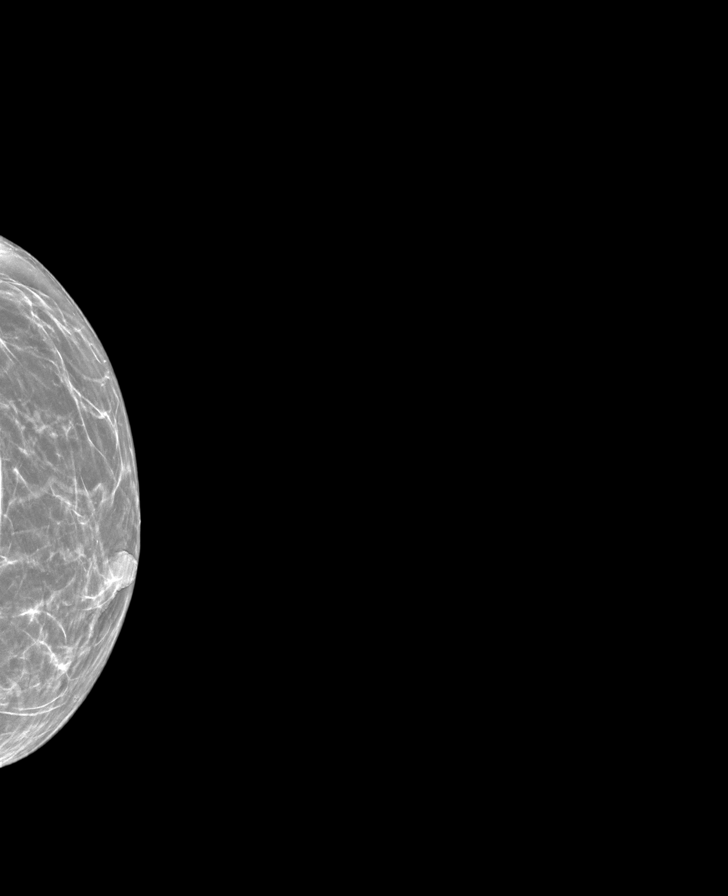

[R MLO synth-2D (1 of 2)]
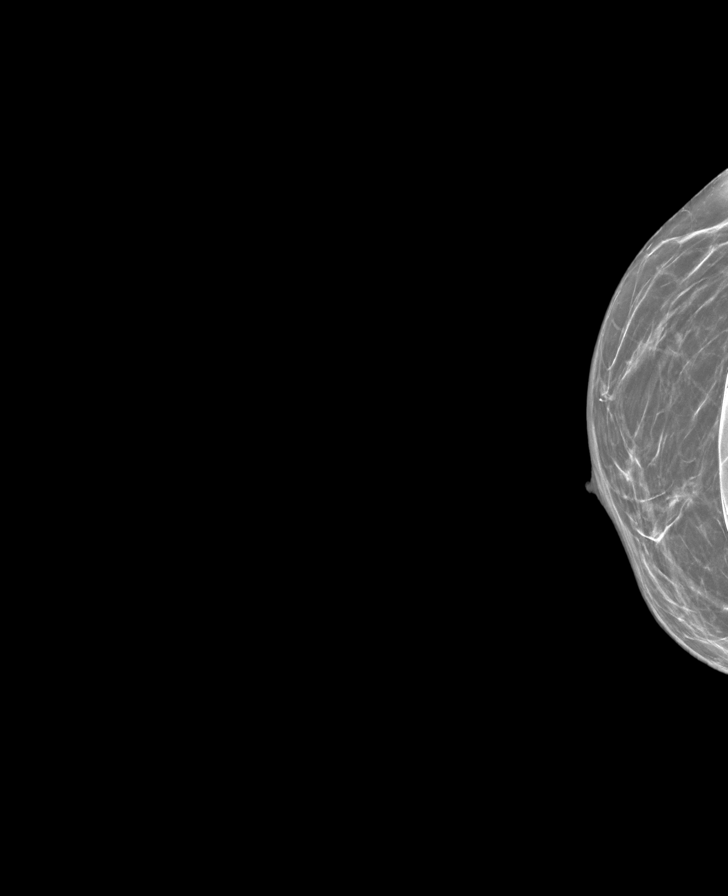

[L MLO synth-2D (2 of 2)]
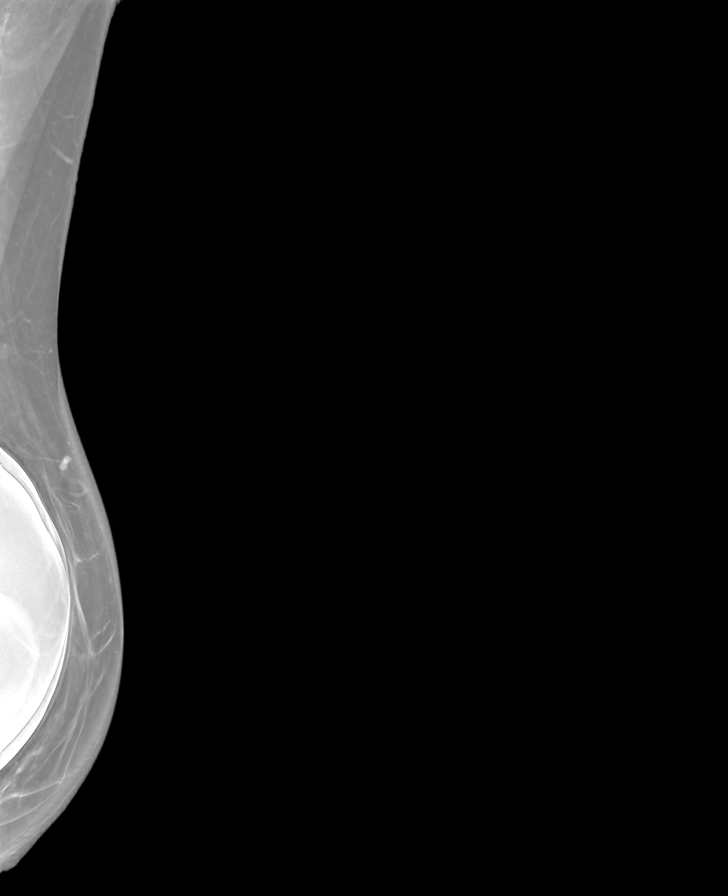

[R MLO synth-2D (2 of 2)]
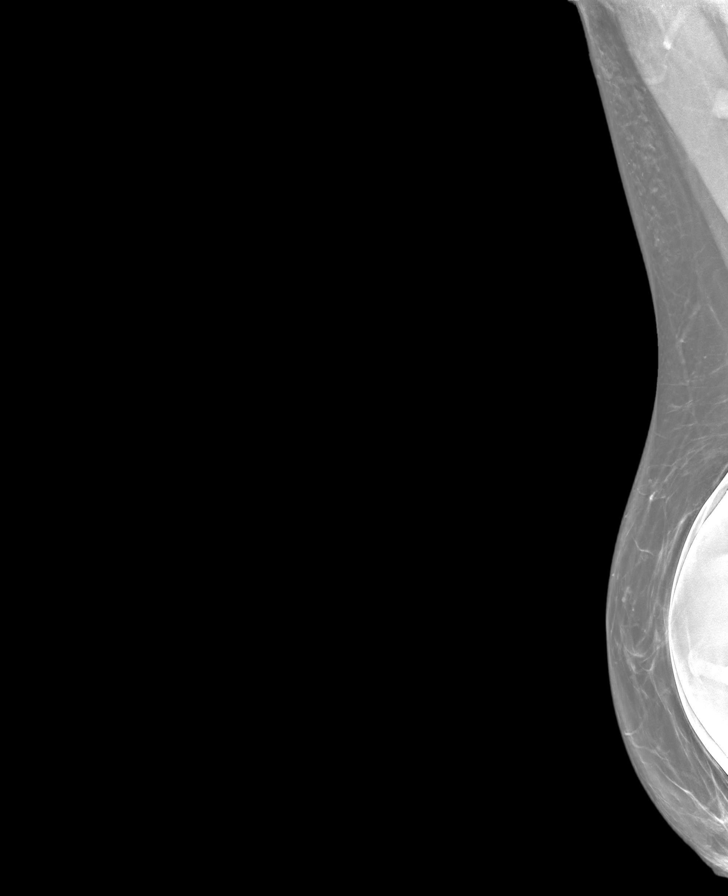

[8 of 40 positions shown; findings below may reference images not displayed]

ACR Breast Density Category b: There are scattered areas of
fibroglandular density.
FINDINGS: The patient has prepectoral implants. There are no findings
suspicious for malignancy.
IMPRESSION: No mammographic evidence of malignancy. A result letter of this
screening mammogram will be mailed directly to the patient.

RECOMMENDATION:
Screening mammogram in one year. (Code:TC-L-OXK)

BI-RADS CATEGORY  1:  Negative.

## 2023-08-24 DIAGNOSIS — Z419 Encounter for procedure for purposes other than remedying health state, unspecified: Secondary | ICD-10-CM | POA: Diagnosis not present

## 2023-09-23 DIAGNOSIS — Z419 Encounter for procedure for purposes other than remedying health state, unspecified: Secondary | ICD-10-CM | POA: Diagnosis not present

## 2023-09-29 DIAGNOSIS — S161XXA Strain of muscle, fascia and tendon at neck level, initial encounter: Secondary | ICD-10-CM | POA: Diagnosis not present

## 2023-09-29 DIAGNOSIS — M542 Cervicalgia: Secondary | ICD-10-CM | POA: Diagnosis not present

## 2023-09-29 DIAGNOSIS — S39012A Strain of muscle, fascia and tendon of lower back, initial encounter: Secondary | ICD-10-CM | POA: Diagnosis not present

## 2023-09-29 DIAGNOSIS — M545 Low back pain, unspecified: Secondary | ICD-10-CM | POA: Diagnosis not present

## 2023-10-01 ENCOUNTER — Encounter: Payer: Self-pay | Admitting: Obstetrics and Gynecology

## 2023-10-01 NOTE — Progress Notes (Signed)
Pt saw Dr. Marisue Brooklyn for her Q6 mo med f/u for ADHD. Notes sent to me and reviewed. Pt doing well on Mydayis 50 mg at 7 AM and vyvanse 60mg  at 10 AM daily. Also takes xanax sparingly prn. Recommendations are to cont this dosing regimen. Pt to reach out for Rx RF from me when needed. I send in Rx for pt since she has MCD and her provider is not credentialed with MCD, hence the Rxs wouldn't be covered.

## 2023-10-24 DIAGNOSIS — Z419 Encounter for procedure for purposes other than remedying health state, unspecified: Secondary | ICD-10-CM | POA: Diagnosis not present

## 2023-11-23 DIAGNOSIS — Z419 Encounter for procedure for purposes other than remedying health state, unspecified: Secondary | ICD-10-CM | POA: Diagnosis not present

## 2023-11-27 ENCOUNTER — Other Ambulatory Visit: Payer: Self-pay | Admitting: Obstetrics and Gynecology

## 2023-11-27 ENCOUNTER — Encounter: Payer: Self-pay | Admitting: Obstetrics and Gynecology

## 2023-11-27 DIAGNOSIS — F902 Attention-deficit hyperactivity disorder, combined type: Secondary | ICD-10-CM

## 2023-11-27 MED ORDER — VYVANSE 60 MG PO CAPS
60.0000 mg | ORAL_CAPSULE | ORAL | 0 refills | Status: DC
Start: 2023-11-27 — End: 2023-11-27

## 2023-11-27 MED ORDER — MYDAYIS 50 MG PO CP24: 50.0000 mg | ORAL_CAPSULE | Freq: Every day | ORAL | 0 refills | Status: DC

## 2023-11-27 MED ORDER — MYDAYIS 50 MG PO CP24
50.0000 mg | ORAL_CAPSULE | Freq: Every day | ORAL | 0 refills | Status: DC
Start: 2023-11-27 — End: 2023-11-27

## 2023-11-27 MED ORDER — VYVANSE 60 MG PO CAPS: 60.0000 mg | ORAL_CAPSULE | ORAL | 0 refills | Status: DC

## 2023-11-27 NOTE — Progress Notes (Signed)
Rx RF mydayis 50 mg daily for 3 months total. Rx RF vyvanse 60 mg daily for 2 months since she still has 1 mo RF left. This will make both prescriptions due for renewal at the same time.  Received notes from pt's psych MD 10/24.  Meds ordered this encounter  Medications   DISCONTD: MYDAYIS 50 MG CP24    Sig: Take 1 capsule (50 mg total) by mouth daily. BRAND ONLY; Can RF after 12/27/23    Dispense:  30 capsule    Refill:  0    Order Specific Question:   Supervising Provider    Answer:   Hildred Laser [[id number]]   VYVANSE 60 MG capsule    Sig: Take 1 capsule (60 mg total) by mouth every morning. BRAND ONLY; can RF after 01/27/24    Dispense:  30 capsule    Refill:  0    Order Specific Question:   Supervising Provider    Answer:   Hildred Laser [[id number]]   MYDAYIS 50 MG CP24    Sig: Take 1 capsule (50 mg total) by mouth daily. BRAND ONLY; Can RF after 01/27/24    Dispense:  30 capsule    Refill:  0    Order Specific Question:   Supervising Provider    Answer:   Hildred Laser [[id number]]

## 2023-12-24 DIAGNOSIS — Z419 Encounter for procedure for purposes other than remedying health state, unspecified: Secondary | ICD-10-CM | POA: Diagnosis not present

## 2024-01-01 ENCOUNTER — Encounter: Payer: Self-pay | Admitting: Obstetrics and Gynecology

## 2024-01-01 NOTE — Telephone Encounter (Signed)
PA submitted and pt aware

## 2024-01-01 NOTE — Telephone Encounter (Signed)
Pls look into this

## 2024-01-01 NOTE — Telephone Encounter (Signed)
Pls look into this. Thx.

## 2024-01-05 NOTE — Telephone Encounter (Signed)
 Brianna Perkins

## 2024-01-20 ENCOUNTER — Ambulatory Visit
Admission: EM | Admit: 2024-01-20 | Discharge: 2024-01-20 | Disposition: A | Discharge status: Home / Self Care | Payer: Medicaid Other | Attending: Nurse Practitioner | Admitting: Nurse Practitioner

## 2024-01-20 ENCOUNTER — Encounter: Payer: Self-pay | Admitting: Emergency Medicine

## 2024-01-20 DIAGNOSIS — N200 Calculus of kidney: Secondary | ICD-10-CM | POA: Diagnosis not present

## 2024-01-20 DIAGNOSIS — R109 Unspecified abdominal pain: Secondary | ICD-10-CM | POA: Diagnosis not present

## 2024-01-20 LAB — POCT URINALYSIS DIP (MANUAL ENTRY)
Bilirubin, UA: NEGATIVE
Glucose, UA: NEGATIVE mg/dL
Leukocytes, UA: NEGATIVE
Nitrite, UA: NEGATIVE
Protein Ur, POC: NEGATIVE mg/dL
Spec Grav, UA: 1.02 (ref 1.010–1.025)
Urobilinogen, UA: 0.2 U/dL
pH, UA: 5.5 (ref 5.0–8.0)

## 2024-01-20 MED ORDER — KETOROLAC TROMETHAMINE 60 MG/2ML IM SOLN
60.0000 mg | Freq: Once | INTRAMUSCULAR | Status: AC
  Administered 2024-01-20: 60 mg via INTRAMUSCULAR

## 2024-01-20 MED ORDER — OXYCODONE-ACETAMINOPHEN 5-325 MG PO TABS: 1.0000 | ORAL_TABLET | Freq: Two times a day (BID) | ORAL | 0 refills | Status: DC | PRN

## 2024-01-20 MED ORDER — TAMSULOSIN HCL 0.4 MG PO CAPS: 0.4000 mg | ORAL_CAPSULE | Freq: Every day | ORAL | 0 refills | Status: DC

## 2024-01-20 NOTE — ED Provider Notes (Signed)
[facility]    CSN: [id number] Arrival date & time: 01/20/24  1545      History   Chief Complaint No chief complaint on file.   HPI Brianna Perkins is a 43 y.o. female.   The history is provided by the patient.   Patient presents for complaints of right sided low back pain and right-sided lower abdominal pain that started this morning.  Patient states symptoms with sudden onset, states "I felt like someone stabbed me in my back."  She states as the day has progressed, she feels the pain is wrapping around her lower abdomen.  States that she has had difficulty getting comfortable.  She states that she has had the urge to urinate all day, but when she goes she cannot.  States that she did take Midol and use a heating pad for her symptoms with minimal relief.  States that the pain comes and goes, but never goes away completely.  Denies fever, chills, nausea, vomiting, dysuria, hematuria, or vaginal symptoms.   Past Medical History:  Diagnosis Date   ADHD    Anxiety    Platelets decreased [facility])     Patient Active Problem List   Diagnosis Date Noted   Attention deficit hyperactivity disorder (ADHD), combined type 01/28/2023    Past Surgical History:  Procedure Laterality Date   AUGMENTATION MAMMAPLASTY Bilateral 2001   saline/ in front of the muscle   BREAST SURGERY     BUNIONECTOMY     bilateral   COSMETIC SURGERY     NECK SURGERY      OB History     Gravida  2   Para  2   Term  2   Preterm      AB      Living  2      SAB      IAB      Ectopic      Multiple      Live Births  2            Home Medications    Prior to Admission medications   Medication Sig Start Date End Date Taking? Authorizing Provider  ALPRAZolam Prudy Feeler) 0.5 MG tablet Take 1 tablet (0.5 mg total) by mouth 2 (two) times daily as needed for anxiety. 01/28/23   Copland, Helmut Muster B, PA-C  fluticasone (FLONASE) 50 MCG/ACT nasal spray Place 1 spray into both nostrils  daily.    [provider]  levonorgestrel (MIRENA) 20 MCG/24HR IUD 1 each by Intrauterine route once. 10/29/2018    [provider]  MYDAYIS 50 MG CP24 Take 1 capsule (50 mg total) by mouth daily. BRAND ONLY; Can RF after 01/27/24 11/27/23   Copland, Ilona Sorrel, PA-C  VYVANSE 60 MG capsule Take 1 capsule (60 mg total) by mouth every morning. BRAND ONLY; can RF after 01/27/24 11/27/23   Copland, Ilona Sorrel, PA-C    Family History Family History  Problem Relation Age of Onset   Prostate cancer Father        74s   Leukemia Paternal Uncle    Prostate cancer Paternal Uncle        34s (identical twin to dad)   Breast cancer Neg Hx     Social History Social History   Tobacco Use   Smoking status: Never   Smokeless tobacco: Never  Vaping Use   Vaping status: Never Used  Substance Use Topics   Alcohol use: Yes    Comment: socially  Drug use: No     Allergies   Patient has no known allergies.   Review of Systems Review of Systems Per HPI  Physical Exam Triage Vital Signs ED Triage Vitals  Encounter Vitals Group     BP 01/20/24 1614 (!) 176/103     Systolic BP Percentile --      Diastolic BP Percentile --      Pulse Rate 01/20/24 1614 86     Resp 01/20/24 1614 18     Temp 01/20/24 1614 98.6 F (37 C)     Temp Source 01/20/24 1614 Oral     SpO2 01/20/24 1614 99 %     Weight --      Height --      Head Circumference --      Peak Flow --      Pain Score 01/20/24 1617 9     Pain Loc --      Pain Education --      Exclude from Growth Chart --    No data found.  Updated Vital Signs BP (!) 176/103 (BP Location: Right Arm)   Pulse 86   Temp 98.6 F (37 C) (Oral)   Resp 18   SpO2 99%   Visual Acuity Right Eye Distance:   Left Eye Distance:   Bilateral Distance:    Right Eye Near:   Left Eye Near:    Bilateral Near:     Physical Exam Vitals and nursing note reviewed.  Constitutional:      General: She is not in acute distress.    Appearance:  Normal appearance.  HENT:     Head: Normocephalic.     Mouth/Throat:     Mouth: Mucous membranes are moist.  Eyes:     Extraocular Movements: Extraocular movements intact.     Pupils: Pupils are equal, round, and reactive to light.  Cardiovascular:     Rate and Rhythm: Normal rate and regular rhythm.     Pulses: Normal pulses.     Heart sounds: Normal heart sounds.  Pulmonary:     Effort: Pulmonary effort is normal. No respiratory distress.     Breath sounds: Normal breath sounds. No stridor. No wheezing, rhonchi or rales.  Abdominal:     General: Bowel sounds are normal. There is no distension.     Palpations: Abdomen is soft. There is no mass.     Tenderness: There is no abdominal tenderness. There is right CVA tenderness. There is no left CVA tenderness, guarding or rebound.     Hernia: No hernia is present.  Musculoskeletal:     Cervical back: Normal range of motion.  Skin:    General: Skin is warm and dry.  Neurological:     General: No focal deficit present.     Mental Status: She is alert and oriented to person, place, and time.  Psychiatric:        Mood and Affect: Mood normal.        Behavior: Behavior normal.      UC Treatments / Results  Labs (all labs ordered are listed, but only abnormal results are displayed) Labs Reviewed  POCT URINALYSIS DIP (MANUAL ENTRY) - Abnormal; Notable for the following components:      Result Value   Clarity, UA cloudy (*)    Ketones, POC UA trace (5) (*)    Blood, UA large (*)    All other components within normal limits    EKG   Radiology No results  found.  Procedures Procedures (including critical care time)  Medications Ordered in UC Medications  ketorolac (TORADOL) injection 60 mg (60 mg Intramuscular Given 01/20/24 1647)    Initial Impression / Assessment and Plan / UC Course  I have reviewed the triage vital signs and the nursing notes.  Pertinent labs & imaging results that were available during my care of  the patient were reviewed by me and considered in my medical decision making (see chart for details).  Toradol 60 mg IM administered for pain.  Urinalysis with large blood, and given the patient's current presentation, symptoms consistent with kidney stone.  Urine culture is pending.  Will start patient on tamsulosin 0.4 mg to help increase urinary flow.  Oxycodone 5/325 mg tablets also administered for pain.  Supportive care recommendations were provided and discussed with the patient to include increasing her fluid intake, rest, and to monitor for worsening symptoms.  Patient was advised that if pain becomes uncontrolled, it is recommended that she go to the emergency department for further evaluation.  Patient also advised that she may need follow-up with urology after symptoms improved.  Patient was in agreement with this plan of care and verbalizes understanding.  All questions were answered.  Patient stable for discharge.   Final Clinical Impressions(s) / UC Diagnoses   Final diagnoses:  Right flank pain  Kidney stone on right side     Discharge Instructions      You were given an injection of Toradol 60 mg.  Do not take any additional NSAIDs such as ibuprofen, Aleve, Advil, Motrin, or naproxen.  You may take over-the-counter Tylenol as needed for breakthrough pain or discomfort. Take medication as prescribed. Make sure you are drinking at least 10-12 8 ounce glasses of water while symptoms persist. Try to strain each urine to see if you have passed the stone. Monitor symptoms for worsening.  If you experience worsening pain, or develop new symptoms of fever, chills, nausea, vomiting, or other concerns, please go to the emergency department immediately for further evaluation. I am providing information for urologist in the area for follow-up once symptoms improved. Follow-up as needed.     ED Prescriptions   None    PDMP not reviewed this encounter.   Abran Cantor,  NP 01/20/24 1700

## 2024-01-20 NOTE — ED Triage Notes (Signed)
Pain and lower ABD pressure that started this morning.  States stools were loose this morning.  States she started to have right sided back pain that was intense this morning.

## 2024-01-20 NOTE — Discharge Instructions (Addendum)
You were given an injection of Toradol 60 mg.  Do not take any additional NSAIDs such as ibuprofen, Aleve, Advil, Motrin, or naproxen.  You may take over-the-counter Tylenol as needed for breakthrough pain or discomfort. Take medication as prescribed. Make sure you are drinking at least 10-12 8 ounce glasses of water while symptoms persist. Try to strain each urine to see if you have passed the stone. Monitor symptoms for worsening.  If you experience worsening pain, or develop new symptoms of fever, chills, nausea, vomiting, or other concerns, please go to the emergency department immediately for further evaluation. I am providing information for urologist in the area for follow-up once symptoms improved. Follow-up as needed.

## 2024-01-21 LAB — URINE CULTURE: Culture: NO GROWTH

## 2024-01-24 DIAGNOSIS — Z419 Encounter for procedure for purposes other than remedying health state, unspecified: Secondary | ICD-10-CM | POA: Diagnosis not present

## 2024-02-21 DIAGNOSIS — Z419 Encounter for procedure for purposes other than remedying health state, unspecified: Secondary | ICD-10-CM | POA: Diagnosis not present

## 2024-03-09 ENCOUNTER — Other Ambulatory Visit: Payer: Self-pay | Admitting: Obstetrics and Gynecology

## 2024-03-09 ENCOUNTER — Encounter: Payer: Self-pay | Admitting: Obstetrics and Gynecology

## 2024-03-09 DIAGNOSIS — F902 Attention-deficit hyperactivity disorder, combined type: Secondary | ICD-10-CM

## 2024-03-09 MED ORDER — MYDAYIS 50 MG PO CP24
50.0000 mg | ORAL_CAPSULE | Freq: Every day | ORAL | 0 refills | Status: DC
Start: 2024-03-09 — End: 2024-04-12

## 2024-03-09 NOTE — Progress Notes (Signed)
 Rx RF mydayis. Pt scheduling annual.

## 2024-03-24 ENCOUNTER — Other Ambulatory Visit: Payer: Self-pay | Admitting: Obstetrics and Gynecology

## 2024-03-24 DIAGNOSIS — F902 Attention-deficit hyperactivity disorder, combined type: Secondary | ICD-10-CM

## 2024-03-24 MED ORDER — VYVANSE 60 MG PO CAPS: 60.0000 mg | ORAL_CAPSULE | ORAL | 0 refills | Status: DC

## 2024-03-24 NOTE — Progress Notes (Signed)
 Rx RF vyvanse till her psych appt

## 2024-03-31 ENCOUNTER — Ambulatory Visit
Admission: RE | Admit: 2024-03-31 | Discharge: 2024-03-31 | Discharge status: Home / Self Care | Source: Ambulatory Visit | Attending: Obstetrics and Gynecology

## 2024-03-31 ENCOUNTER — Other Ambulatory Visit: Payer: Self-pay | Admitting: Obstetrics and Gynecology

## 2024-03-31 DIAGNOSIS — Z1231 Encounter for screening mammogram for malignant neoplasm of breast: Secondary | ICD-10-CM

## 2024-04-01 ENCOUNTER — Other Ambulatory Visit (HOSPITAL_COMMUNITY): Payer: Self-pay

## 2024-04-01 MED ORDER — TETANUS-DIPHTH-ACELL PERTUSSIS 5-2.5-18.5 LF-MCG/0.5 IM SUSY
0.5000 mL | PREFILLED_SYRINGE | Freq: Once | INTRAMUSCULAR | 0 refills | Status: AC
  Filled 2024-04-01: qty 0.5, 1d supply, fill #0

## 2024-04-03 DIAGNOSIS — Z419 Encounter for procedure for purposes other than remedying health state, unspecified: Secondary | ICD-10-CM | POA: Diagnosis not present

## 2024-04-05 ENCOUNTER — Encounter: Payer: Self-pay | Admitting: Obstetrics and Gynecology

## 2024-04-12 ENCOUNTER — Other Ambulatory Visit (HOSPITAL_BASED_OUTPATIENT_CLINIC_OR_DEPARTMENT_OTHER): Payer: Self-pay

## 2024-04-12 ENCOUNTER — Other Ambulatory Visit: Payer: Self-pay | Admitting: Obstetrics and Gynecology

## 2024-04-12 DIAGNOSIS — F902 Attention-deficit hyperactivity disorder, combined type: Secondary | ICD-10-CM

## 2024-04-12 MED ORDER — MYDAYIS 50 MG PO CP24
50.0000 mg | ORAL_CAPSULE | Freq: Every day | ORAL | 0 refills | Status: DC
  Filled 2024-04-12: qty 30, 30d supply, fill #0

## 2024-04-12 MED ORDER — VYVANSE 60 MG PO CAPS
60.0000 mg | ORAL_CAPSULE | ORAL | 0 refills | Status: DC
  Filled 2024-04-12: qty 30, 30d supply, fill #0

## 2024-04-14 ENCOUNTER — Other Ambulatory Visit (HOSPITAL_BASED_OUTPATIENT_CLINIC_OR_DEPARTMENT_OTHER): Payer: Self-pay

## 2024-04-14 NOTE — Telephone Encounter (Signed)
 Pls call new pharmacy. Both prescriptions say BRAND ONLY on them specifically already.

## 2024-04-14 NOTE — Progress Notes (Unsigned)
 PCP:  Patient, No Pcp Per   No chief complaint on file.    HPI:      Ms. Oluwanifemi R Jester is a 43 y.o. No obstetric history on file. whose LMP was No LMP recorded. (Menstrual status: IUD)., presents today for her annual examination.  Her menses are monthly with IUD, lasting 5 days, mod flow, occas BTB, mild dysmen. Had been amenorrheic in past with IUD.   Sex activity: single partner, contraception - IUD. Mirena placed 10/29/2018. No dyspareunia/bleeding. Last Pap: 01/16/21 Results were normal/neg HPV DNA; 09/29/18, 01/30/17,01/22/16, 11/01/14 pap results were neg per reviewed med records. Don't see any HPV testing results; hx of abn in college with repeats. No tx done.  Hx of STDs: HPV  Last mammogram: 03/31/24 Results were normal, repeat in 12 months. Has appt 3/24. Has implants bilat. There is no FH of breast cancer. There is no FH of ovarian cancer. The patient does do self-breast exams.  Tobacco use: The patient denies current or previous tobacco use. Alcohol use: few drinks wkly No drug use.  Exercise: moderately active  She does get adequate calcium and Vitamin D in her diet. Labs with PCP   Pt seeing psych for ADD, meds Rxd by me for insurance coverage since pt has MCD and her psych MD is out of network for MCD. MD sends me copies of her notes and manages pt's current tx regimen. Pt knows this is a courtesy to her since we don't manage these meds.  Seeing psych now Q 6months, was Q3 months but doing well on med regimen. Meds can only be Rxd 3 months at a time. Pt last saw provider 10/23, has f/u 4/24. On mydayis  50 mg in early AM, vyvanse  50 mg mid AM, aprazolam 0.5 mg BID prn.   Past Medical History:  Diagnosis Date   ADHD    Anxiety    Platelets decreased (HCC)     Past Surgical History:  Procedure Laterality Date   AUGMENTATION MAMMAPLASTY Bilateral 2001   saline/ in front of the muscle   BREAST SURGERY     BUNIONECTOMY     bilateral   COSMETIC SURGERY     NECK  SURGERY      Family History  Problem Relation Age of Onset   Prostate cancer Father        60s   Leukemia Paternal Uncle    Prostate cancer Paternal Uncle        82s (identical twin to dad)   Breast cancer Neg Hx     Social History   Socioeconomic History   Marital status: Divorced    Spouse name: Not on file   Number of children: Not on file   Years of education: Not on file   Highest education level: Not on file  Occupational History   Not on file  Tobacco Use   Smoking status: Never   Smokeless tobacco: Never  Vaping Use   Vaping status: Never Used  Substance and Sexual Activity   Alcohol use: Yes    Comment: socially   Drug use: No   Sexual activity: Yes    Birth control/protection: I.U.D.    Comment: Mirena  Other Topics Concern   Not on file  Social History Narrative   Not on file   Social Drivers of Health   Financial Resource Strain: Not on file  Food Insecurity: Not on file  Transportation Needs: Not on file  Physical Activity: Not on file  Stress:  Not on file  Social Connections: Not on file  Intimate Partner Violence: Not on file     Current Outpatient Medications:    ALPRAZolam  (XANAX ) 0.5 MG tablet, Take 1 tablet (0.5 mg total) by mouth 2 (two) times daily as needed for anxiety., Disp: 60 tablet, Rfl: 0   fluticasone (FLONASE) 50 MCG/ACT nasal spray, Place 1 spray into both nostrils daily., Disp: , Rfl:    levonorgestrel (MIRENA) 20 MCG/24HR IUD, 1 each by Intrauterine route once. 10/29/2018, Disp: , Rfl:    MYDAYIS  50 MG CP24, Take 1 capsule (50 mg total) by mouth daily. BRAND ONLY., Disp: 30 capsule, Rfl: 0   oxyCODONE -acetaminophen  (PERCOCET/ROXICET) 5-325 MG tablet, Take 1 tablet by mouth 2 (two) times daily as needed for severe pain (pain score 7-10)., Disp: 10 tablet, Rfl: 0   tamsulosin  (FLOMAX ) 0.4 MG CAPS capsule, Take 1 capsule (0.4 mg total) by mouth daily after supper., Disp: 30 capsule, Rfl: 0   VYVANSE  60 MG capsule, Take 1 capsule  (60 mg total) by mouth every morning. BRAND ONLY., Disp: 30 capsule, Rfl: 0     ROS:  Review of Systems  Constitutional:  Negative for fatigue, fever and unexpected weight change.  Respiratory:  Negative for cough, shortness of breath and wheezing.   Cardiovascular:  Negative for chest pain, palpitations and leg swelling.  Gastrointestinal:  Negative for blood in stool, constipation, diarrhea, nausea and vomiting.  Endocrine: Negative for cold intolerance, heat intolerance and polyuria.  Genitourinary:  Negative for dyspareunia, dysuria, flank pain, frequency, genital sores, hematuria, menstrual problem, pelvic pain, urgency, vaginal bleeding, vaginal discharge and vaginal pain.  Musculoskeletal:  Negative for back pain, joint swelling and myalgias.  Skin:  Negative for rash.  Neurological:  Negative for dizziness, syncope, light-headedness, numbness and headaches.  Hematological:  Negative for adenopathy.  Psychiatric/Behavioral:  Negative for agitation, confusion, sleep disturbance and suicidal ideas. The patient is not nervous/anxious.   BREAST: No symptoms   Objective: There were no vitals taken for this visit.   Physical Exam Constitutional:      Appearance: She is well-developed.  Genitourinary:     Vulva normal.     Right Labia: No rash, tenderness or lesions.    Left Labia: No tenderness, lesions or rash.    No vaginal discharge, erythema or tenderness.      Right Adnexa: not tender and no mass present.    Left Adnexa: not tender and no mass present.    No cervical friability or polyp.     IUD strings visualized.     Uterus is not enlarged or tender.  Breasts:    Right: No mass, nipple discharge, skin change or tenderness.     Left: No mass, nipple discharge, skin change or tenderness.  Neck:     Thyroid: No thyromegaly.  Cardiovascular:     Rate and Rhythm: Normal rate and regular rhythm.     Heart sounds: Normal heart sounds. No murmur heard. Pulmonary:      Effort: Pulmonary effort is normal.     Breath sounds: Normal breath sounds.  Abdominal:     Palpations: Abdomen is soft.     Tenderness: There is no abdominal tenderness. There is no guarding or rebound.  Musculoskeletal:        General: Normal range of motion.     Cervical back: Normal range of motion.  Lymphadenopathy:     Cervical: No cervical adenopathy.  Neurological:     General: No focal deficit present.  Mental Status: She is alert and oriented to person, place, and time.     Cranial Nerves: No cranial nerve deficit.  Skin:    General: Skin is warm and dry.  Psychiatric:        Mood and Affect: Mood normal.        Behavior: Behavior normal.        Thought Content: Thought content normal.        Judgment: Judgment normal.  Vitals reviewed.    ASSESSMENT/PLAN:  Encounter for annual routine gynecological examination  Encounter for routine checking of intrauterine contraceptive device (IUD)--IUD strings in cx os, has 8 yr indication  Breakthrough bleeding with IUD--pt bleeding now with IUD. Discussed replacing IUD early due to bleeding. Pt to RTO with menses for replacement if desires.   Encounter for screening mammogram for malignant neoplasm of breast; pt has mammo appt  Attention deficit hyperactivity disorder (ADHD), combined type - Plan: ALPRAZolam  (XANAX ) 0.5 MG tablet, lisdexamfetamine (VYVANSE ) 50 MG capsule, MYDAYIS  50 MG CP24, DISCONTINUED: lisdexamfetamine (VYVANSE ) 50 MG capsule, DISCONTINUED: MYDAYIS  50 MG CP24, DISCONTINUED: lisdexamfetamine (VYVANSE ) 50 MG capsule, DISCONTINUED: MYDAYIS  50 MG CP24; Rx RF eRxd for 3 months; pt has appt with MD 4/24.    No orders of the defined types were placed in this encounter.          GYN counsel breast self exam, mammography screening, adequate intake of calcium and vitamin D, diet and exercise     F/U  No follow-ups on file.  Teliah [place] B. Johnell Landowski, PA-C 04/14/2024 8:20 PM

## 2024-04-15 ENCOUNTER — Other Ambulatory Visit (HOSPITAL_BASED_OUTPATIENT_CLINIC_OR_DEPARTMENT_OTHER): Payer: Self-pay

## 2024-04-15 ENCOUNTER — Ambulatory Visit (INDEPENDENT_AMBULATORY_CARE_PROVIDER_SITE_OTHER): Admitting: Obstetrics and Gynecology

## 2024-04-15 ENCOUNTER — Other Ambulatory Visit: Payer: Self-pay | Admitting: Obstetrics and Gynecology

## 2024-04-15 ENCOUNTER — Encounter: Payer: Self-pay | Admitting: Obstetrics and Gynecology

## 2024-04-15 ENCOUNTER — Other Ambulatory Visit (HOSPITAL_COMMUNITY)
Admission: RE | Admit: 2024-04-15 | Discharge: 2024-04-15 | Disposition: A | Discharge status: Home / Self Care | Source: Ambulatory Visit | Attending: Obstetrics and Gynecology | Admitting: Obstetrics and Gynecology

## 2024-04-15 VITALS — BP 131/71 | HR 85 | Ht 67.0 in | Wt 172.0 lb

## 2024-04-15 DIAGNOSIS — Z0000 Encounter for general adult medical examination without abnormal findings: Secondary | ICD-10-CM | POA: Diagnosis not present

## 2024-04-15 DIAGNOSIS — Z01419 Encounter for gynecological examination (general) (routine) without abnormal findings: Secondary | ICD-10-CM

## 2024-04-15 DIAGNOSIS — Z1151 Encounter for screening for human papillomavirus (HPV): Secondary | ICD-10-CM

## 2024-04-15 DIAGNOSIS — Z124 Encounter for screening for malignant neoplasm of cervix: Secondary | ICD-10-CM | POA: Diagnosis not present

## 2024-04-15 DIAGNOSIS — Z1329 Encounter for screening for other suspected endocrine disorder: Secondary | ICD-10-CM

## 2024-04-15 DIAGNOSIS — F902 Attention-deficit hyperactivity disorder, combined type: Secondary | ICD-10-CM

## 2024-04-15 DIAGNOSIS — R635 Abnormal weight gain: Secondary | ICD-10-CM

## 2024-04-15 DIAGNOSIS — D696 Thrombocytopenia, unspecified: Secondary | ICD-10-CM

## 2024-04-15 DIAGNOSIS — Z1231 Encounter for screening mammogram for malignant neoplasm of breast: Secondary | ICD-10-CM

## 2024-04-15 DIAGNOSIS — Z30431 Encounter for routine checking of intrauterine contraceptive device: Secondary | ICD-10-CM

## 2024-04-15 DIAGNOSIS — Z131 Encounter for screening for diabetes mellitus: Secondary | ICD-10-CM | POA: Diagnosis not present

## 2024-04-15 MED ORDER — VYVANSE 60 MG PO CAPS
60.0000 mg | ORAL_CAPSULE | ORAL | 0 refills | Status: DC
  Filled 2024-04-15 – 2024-04-30 (×2): qty 30, 30d supply, fill #0

## 2024-04-15 MED ORDER — MYDAYIS 50 MG PO CP24
50.0000 mg | ORAL_CAPSULE | Freq: Every day | ORAL | 0 refills | Status: DC
  Filled 2024-04-15: qty 30, 30d supply, fill #0

## 2024-04-15 NOTE — Patient Instructions (Signed)
 I value your feedback and you entrusting [place] with your care. If you get a [place] patient survey, I would appreciate you taking the time to let us know about your experience today. Thank you! ? ? ?

## 2024-04-16 ENCOUNTER — Encounter: Payer: Self-pay | Admitting: Obstetrics and Gynecology

## 2024-04-16 LAB — COMPREHENSIVE METABOLIC PANEL WITH GFR
ALT: 14 IU/L (ref 0–32)
AST: 16 IU/L (ref 0–40)
Albumin: 4.8 g/dL (ref 3.9–4.9)
Alkaline Phosphatase: 97 IU/L (ref 44–121)
BUN/Creatinine Ratio: 17 (ref 9–23)
BUN: 14 mg/dL (ref 6–24)
Bilirubin Total: 0.4 mg/dL (ref 0.0–1.2)
CO2: 20 mmol/L (ref 20–29)
Calcium: 9.4 mg/dL (ref 8.7–10.2)
Chloride: 101 mmol/L (ref 96–106)
Creatinine, Ser: 0.84 mg/dL (ref 0.57–1.00)
Globulin, Total: 2.3 g/dL (ref 1.5–4.5)
Glucose: 97 mg/dL (ref 70–99)
Potassium: 3.9 mmol/L (ref 3.5–5.2)
Sodium: 138 mmol/L (ref 134–144)
Total Protein: 7.1 g/dL (ref 6.0–8.5)
eGFR: 88 mL/min/{1.73_m2} (ref >59)

## 2024-04-16 LAB — CBC WITH DIFFERENTIAL/PLATELET
Basophils Absolute: 0 10*3/uL (ref 0.0–0.2)
Basos: 0 %
EOS (ABSOLUTE): 0 10*3/uL (ref 0.0–0.4)
Eos: 0 %
Hematocrit: 42.5 % (ref 34.0–46.6)
Hemoglobin: 14.2 g/dL (ref 11.1–15.9)
Immature Grans (Abs): 0 10*3/uL (ref 0.0–0.1)
Immature Granulocytes: 0 %
Lymphocytes Absolute: 1.4 10*3/uL (ref 0.7–3.1)
Lymphs: 21 %
MCH: 31 pg (ref 26.6–33.0)
MCHC: 33.4 g/dL (ref 31.5–35.7)
MCV: 93 fL (ref 79–97)
Monocytes Absolute: 0.5 10*3/uL (ref 0.1–0.9)
Monocytes: 7 %
Neutrophils Absolute: 4.8 10*3/uL (ref 1.4–7.0)
Neutrophils: 72 %
Platelets: 180 10*3/uL (ref 150–450)
RBC: 4.58 x10E6/uL (ref 3.77–5.28)
RDW: 12.5 % (ref 11.7–15.4)
WBC: 6.7 10*3/uL (ref 3.4–10.8)

## 2024-04-16 LAB — TSH+FREE T4
Free T4: 1.21 ng/dL (ref 0.82–1.77)
TSH: 1.74 u[IU]/mL (ref 0.450–4.500)

## 2024-04-16 LAB — HEMOGLOBIN A1C
Est. average glucose Bld gHb Est-mCnc: 111 mg/dL
Hgb A1c MFr Bld: 5.5 % (ref 4.8–5.6)

## 2024-04-19 LAB — CYTOLOGY - PAP
Comment: NEGATIVE
Diagnosis: NEGATIVE
High risk HPV: NEGATIVE

## 2024-04-30 ENCOUNTER — Other Ambulatory Visit (HOSPITAL_BASED_OUTPATIENT_CLINIC_OR_DEPARTMENT_OTHER): Payer: Self-pay

## 2024-05-03 ENCOUNTER — Other Ambulatory Visit: Payer: Self-pay | Admitting: Obstetrics and Gynecology

## 2024-05-03 ENCOUNTER — Other Ambulatory Visit (HOSPITAL_BASED_OUTPATIENT_CLINIC_OR_DEPARTMENT_OTHER): Payer: Self-pay

## 2024-05-03 DIAGNOSIS — Z419 Encounter for procedure for purposes other than remedying health state, unspecified: Secondary | ICD-10-CM | POA: Diagnosis not present

## 2024-05-03 DIAGNOSIS — F902 Attention-deficit hyperactivity disorder, combined type: Secondary | ICD-10-CM

## 2024-05-03 MED ORDER — VYVANSE 60 MG PO CAPS
60.0000 mg | ORAL_CAPSULE | ORAL | 0 refills | Status: DC
  Filled 2024-05-03: qty 30, 30d supply, fill #0

## 2024-05-03 MED ORDER — MYDAYIS 50 MG PO CP24
50.0000 mg | ORAL_CAPSULE | Freq: Every day | ORAL | 0 refills | Status: DC
  Filled 2024-05-03: qty 30, 30d supply, fill #0

## 2024-05-03 MED ORDER — MYDAYIS 50 MG PO CP24
50.0000 mg | ORAL_CAPSULE | Freq: Every day | ORAL | 0 refills | Status: DC
  Filled 2024-05-03 – 2024-07-15 (×2): qty 30, 30d supply, fill #0

## 2024-05-03 MED ORDER — VYVANSE 60 MG PO CAPS
60.0000 mg | ORAL_CAPSULE | ORAL | 0 refills | Status: DC
  Filled 2024-05-03 – 2024-08-18 (×2): qty 30, 30d supply, fill #0

## 2024-05-03 NOTE — Progress Notes (Signed)
 Rx RF for 3 months Mydayis  and vyvanse  to [facility]

## 2024-05-06 ENCOUNTER — Other Ambulatory Visit: Payer: Self-pay | Admitting: Obstetrics and Gynecology

## 2024-05-06 ENCOUNTER — Other Ambulatory Visit (HOSPITAL_BASED_OUTPATIENT_CLINIC_OR_DEPARTMENT_OTHER): Payer: Self-pay

## 2024-05-06 ENCOUNTER — Other Ambulatory Visit: Payer: Self-pay

## 2024-05-06 ENCOUNTER — Other Ambulatory Visit (HOSPITAL_COMMUNITY): Payer: Self-pay

## 2024-05-06 DIAGNOSIS — F902 Attention-deficit hyperactivity disorder, combined type: Secondary | ICD-10-CM

## 2024-05-06 MED ORDER — VYVANSE 60 MG PO CAPS
60.0000 mg | ORAL_CAPSULE | ORAL | 0 refills | Status: AC
  Filled 2024-05-06 – 2024-07-15 (×2): qty 30, 30d supply, fill #0

## 2024-05-06 MED ORDER — MYDAYIS 50 MG PO CP24
50.0000 mg | ORAL_CAPSULE | Freq: Every day | ORAL | 0 refills | Status: DC
  Filled 2024-05-06 – 2024-05-14 (×2): qty 30, 30d supply, fill #0

## 2024-05-06 MED ORDER — VYVANSE 60 MG PO CAPS
60.0000 mg | ORAL_CAPSULE | ORAL | 0 refills | Status: DC
  Filled 2024-05-06 – 2024-06-12 (×2): qty 30, 30d supply, fill #0

## 2024-05-06 MED ORDER — VYVANSE 60 MG PO CAPS
60.0000 mg | ORAL_CAPSULE | ORAL | 0 refills | Status: DC
Start: 2024-05-06 — End: 2024-09-21
  Filled 2024-05-06: qty 30, 30d supply, fill #0

## 2024-05-06 MED ORDER — MYDAYIS 50 MG PO CP24
50.0000 mg | ORAL_CAPSULE | Freq: Every day | ORAL | 0 refills | Status: DC
  Filled 2024-05-06 – 2024-06-12 (×3): qty 30, 30d supply, fill #0

## 2024-05-06 NOTE — Progress Notes (Signed)
 Rx RF vyvanse  and mydayis  for 3 months so as to not get discontinued in Epic system.

## 2024-05-11 ENCOUNTER — Encounter: Payer: Self-pay | Admitting: Obstetrics and Gynecology

## 2024-05-11 NOTE — Progress Notes (Signed)
 Received notes from Dr. Ebony Goldstein re: medication mgmt of pt's ADHD. She is stable on vyvanse  60 mg and Mydayis  50 mg, both in the AM. Pt will f/u again with Dr. Adell Age in 1 yr now since doing well/sooner prn. Rx RFmeds eRxd for 3 months.

## 2024-05-14 ENCOUNTER — Other Ambulatory Visit (HOSPITAL_BASED_OUTPATIENT_CLINIC_OR_DEPARTMENT_OTHER): Payer: Self-pay

## 2024-06-03 DIAGNOSIS — Z419 Encounter for procedure for purposes other than remedying health state, unspecified: Secondary | ICD-10-CM | POA: Diagnosis not present

## 2024-06-12 ENCOUNTER — Other Ambulatory Visit (HOSPITAL_BASED_OUTPATIENT_CLINIC_OR_DEPARTMENT_OTHER): Payer: Self-pay

## 2024-07-03 DIAGNOSIS — Z419 Encounter for procedure for purposes other than remedying health state, unspecified: Secondary | ICD-10-CM | POA: Diagnosis not present

## 2024-07-09 ENCOUNTER — Encounter: Payer: Self-pay | Admitting: Advanced Practice Midwife

## 2024-07-15 ENCOUNTER — Other Ambulatory Visit (HOSPITAL_BASED_OUTPATIENT_CLINIC_OR_DEPARTMENT_OTHER): Payer: Self-pay

## 2024-08-03 DIAGNOSIS — Z419 Encounter for procedure for purposes other than remedying health state, unspecified: Secondary | ICD-10-CM | POA: Diagnosis not present

## 2024-08-18 ENCOUNTER — Other Ambulatory Visit: Payer: Self-pay | Admitting: Obstetrics and Gynecology

## 2024-08-18 ENCOUNTER — Other Ambulatory Visit (HOSPITAL_BASED_OUTPATIENT_CLINIC_OR_DEPARTMENT_OTHER): Payer: Self-pay

## 2024-08-18 DIAGNOSIS — F902 Attention-deficit hyperactivity disorder, combined type: Secondary | ICD-10-CM

## 2024-08-18 MED ORDER — MYDAYIS 50 MG PO CP24
50.0000 mg | ORAL_CAPSULE | Freq: Every day | ORAL | 0 refills | Status: DC
  Filled 2024-08-18 – 2024-09-21 (×2): qty 30, 30d supply, fill #0

## 2024-08-18 MED ORDER — MYDAYIS 50 MG PO CP24
50.0000 mg | ORAL_CAPSULE | Freq: Every day | ORAL | 0 refills | Status: DC
  Filled 2024-08-18: qty 30, 30d supply, fill #0

## 2024-08-18 MED ORDER — MYDAYIS 50 MG PO CP24
50.0000 mg | ORAL_CAPSULE | Freq: Every day | ORAL | 0 refills | Status: DC
  Filled 2024-08-18 – 2024-10-20 (×2): qty 30, 30d supply, fill #0

## 2024-08-18 NOTE — Progress Notes (Signed)
 Rx RF mydayis  for 3 months.

## 2024-09-03 DIAGNOSIS — Z419 Encounter for procedure for purposes other than remedying health state, unspecified: Secondary | ICD-10-CM | POA: Diagnosis not present

## 2024-09-21 ENCOUNTER — Other Ambulatory Visit: Payer: Self-pay | Admitting: Obstetrics and Gynecology

## 2024-09-21 ENCOUNTER — Encounter: Payer: Self-pay | Admitting: Obstetrics and Gynecology

## 2024-09-21 ENCOUNTER — Other Ambulatory Visit (HOSPITAL_BASED_OUTPATIENT_CLINIC_OR_DEPARTMENT_OTHER): Payer: Self-pay

## 2024-09-21 DIAGNOSIS — F902 Attention-deficit hyperactivity disorder, combined type: Secondary | ICD-10-CM

## 2024-09-21 MED ORDER — VYVANSE 60 MG PO CAPS
60.0000 mg | ORAL_CAPSULE | ORAL | 0 refills | Status: DC
  Filled 2024-09-21 – 2024-10-20 (×2): qty 30, 30d supply, fill #0

## 2024-09-21 MED ORDER — VYVANSE 60 MG PO CAPS
60.0000 mg | ORAL_CAPSULE | ORAL | 0 refills | Status: DC
  Filled 2024-09-21: qty 30, 30d supply, fill #0

## 2024-09-21 MED ORDER — VYVANSE 60 MG PO CAPS
60.0000 mg | ORAL_CAPSULE | ORAL | 0 refills | Status: DC
  Filled 2024-09-21 – 2024-11-19 (×3): qty 30, 30d supply, fill #0

## 2024-09-21 NOTE — Progress Notes (Signed)
 Rx RF vyvanse  for 3 months

## 2024-10-03 DIAGNOSIS — Z419 Encounter for procedure for purposes other than remedying health state, unspecified: Secondary | ICD-10-CM | POA: Diagnosis not present

## 2024-10-20 ENCOUNTER — Other Ambulatory Visit (HOSPITAL_BASED_OUTPATIENT_CLINIC_OR_DEPARTMENT_OTHER): Payer: Self-pay

## 2024-10-21 ENCOUNTER — Other Ambulatory Visit (HOSPITAL_BASED_OUTPATIENT_CLINIC_OR_DEPARTMENT_OTHER): Payer: Self-pay

## 2024-11-19 ENCOUNTER — Other Ambulatory Visit: Payer: Self-pay | Admitting: Obstetrics and Gynecology

## 2024-11-19 ENCOUNTER — Encounter: Payer: Self-pay | Admitting: Obstetrics and Gynecology

## 2024-11-19 ENCOUNTER — Other Ambulatory Visit (HOSPITAL_BASED_OUTPATIENT_CLINIC_OR_DEPARTMENT_OTHER): Payer: Self-pay

## 2024-11-19 DIAGNOSIS — F902 Attention-deficit hyperactivity disorder, combined type: Secondary | ICD-10-CM

## 2024-11-22 ENCOUNTER — Other Ambulatory Visit (HOSPITAL_BASED_OUTPATIENT_CLINIC_OR_DEPARTMENT_OTHER): Payer: Self-pay

## 2024-11-22 ENCOUNTER — Other Ambulatory Visit: Payer: Self-pay | Admitting: Obstetrics and Gynecology

## 2024-11-22 DIAGNOSIS — F902 Attention-deficit hyperactivity disorder, combined type: Secondary | ICD-10-CM

## 2024-11-22 MED ORDER — MYDAYIS 50 MG PO CP24
50.0000 mg | ORAL_CAPSULE | Freq: Every day | ORAL | 0 refills | Status: AC
  Filled 2024-11-22 – 2024-12-21 (×2): qty 30, 30d supply, fill #0

## 2024-11-22 MED ORDER — MYDAYIS 50 MG PO CP24
50.0000 mg | ORAL_CAPSULE | Freq: Every day | ORAL | 0 refills | Status: AC
  Filled 2024-11-22 – 2025-01-24 (×4): qty 30, 30d supply, fill #0

## 2024-11-22 MED ORDER — MYDAYIS 50 MG PO CP24
50.0000 mg | ORAL_CAPSULE | Freq: Every day | ORAL | 0 refills | Status: DC
  Filled 2024-11-22: qty 30, 30d supply, fill #0

## 2024-11-22 MED ORDER — MYDAYIS 50 MG PO CP24
50.0000 mg | ORAL_CAPSULE | Freq: Every day | ORAL | 0 refills | Status: AC
  Filled 2024-11-22: qty 30, 30d supply, fill #0

## 2024-11-22 MED ORDER — VYVANSE 60 MG PO CAPS
60.0000 mg | ORAL_CAPSULE | ORAL | 0 refills | Status: AC
  Filled 2024-11-22 – 2025-01-21 (×2): qty 30, 30d supply, fill #0

## 2024-11-22 MED ORDER — VYVANSE 60 MG PO CAPS
60.0000 mg | ORAL_CAPSULE | ORAL | 0 refills | Status: AC
  Filled 2024-11-22: qty 30, 30d supply, fill #0

## 2024-11-22 MED ORDER — VYVANSE 60 MG PO CAPS
60.0000 mg | ORAL_CAPSULE | ORAL | 0 refills | Status: AC
  Filled 2024-11-22 – 2024-12-21 (×2): qty 30, 30d supply, fill #0

## 2024-11-22 NOTE — Progress Notes (Signed)
 Rx RF mydayis  for 3 months, seeing psych yearly now, due for psych appt 4/26.

## 2024-11-22 NOTE — Progress Notes (Signed)
 Rx RF vyvanse  for 3 months, 1 addl mydayis  so can fill at same time as vyvanse  (had 1 less mydayis  Rx than vyvanse ).

## 2024-12-03 DIAGNOSIS — Z419 Encounter for procedure for purposes other than remedying health state, unspecified: Secondary | ICD-10-CM | POA: Diagnosis not present

## 2024-12-04 ENCOUNTER — Other Ambulatory Visit (HOSPITAL_BASED_OUTPATIENT_CLINIC_OR_DEPARTMENT_OTHER): Payer: Self-pay

## 2024-12-21 ENCOUNTER — Other Ambulatory Visit (HOSPITAL_BASED_OUTPATIENT_CLINIC_OR_DEPARTMENT_OTHER): Payer: Self-pay

## 2025-01-21 ENCOUNTER — Telehealth: Payer: Self-pay | Admitting: Pharmacy Technician

## 2025-01-21 ENCOUNTER — Telehealth (HOSPITAL_BASED_OUTPATIENT_CLINIC_OR_DEPARTMENT_OTHER): Payer: Self-pay

## 2025-01-21 ENCOUNTER — Other Ambulatory Visit (HOSPITAL_COMMUNITY): Payer: Self-pay

## 2025-01-21 ENCOUNTER — Encounter: Payer: Self-pay | Admitting: Obstetrics and Gynecology

## 2025-01-21 ENCOUNTER — Other Ambulatory Visit (HOSPITAL_BASED_OUTPATIENT_CLINIC_OR_DEPARTMENT_OTHER): Payer: Self-pay

## 2025-01-21 NOTE — Telephone Encounter (Signed)
 Pharmacy Patient Advocate Encounter  Received notification from [facility] MEDICAID that Prior Authorization for Mydayis  50MG  er capsules has been APPROVED from 01/21/2025 to 01/21/2026.   PA #/Case ID/Reference #: [id number]

## 2025-01-21 NOTE — Telephone Encounter (Signed)
 Pharmacy Patient Advocate Encounter   Received notification from Physician's Office (STAFF MESSAGES) that prior authorization for Mydayis  50MG  er capsules is required/requested.   Insurance verification completed.   The patient is insured through [facility] MEDICAID.   Per test claim: PA required; PA submitted to above mentioned insurance via Latent Key/confirmation #/EOC [id number] Status is pending

## 2025-01-24 ENCOUNTER — Other Ambulatory Visit (HOSPITAL_BASED_OUTPATIENT_CLINIC_OR_DEPARTMENT_OTHER): Payer: Self-pay

## 2025-01-24 ENCOUNTER — Other Ambulatory Visit (HOSPITAL_COMMUNITY): Payer: Self-pay
